# Patient Record
Sex: Male | Born: 1990 | Race: White | Hispanic: No | Marital: Single | Smoking: Former smoker
Health system: Southern US, Community
[De-identification: ages and names within clinical notes are randomized; demographics above are authoritative.]

## PROBLEM LIST (undated history)

## (undated) DIAGNOSIS — F1021 Alcohol dependence, in remission: Secondary | ICD-10-CM

## (undated) DIAGNOSIS — J45909 Unspecified asthma, uncomplicated: Secondary | ICD-10-CM

## (undated) DIAGNOSIS — S43109A Unspecified dislocation of unspecified acromioclavicular joint, initial encounter: Secondary | ICD-10-CM

## (undated) DIAGNOSIS — L910 Hypertrophic scar: Secondary | ICD-10-CM

## (undated) HISTORY — DX: Unspecified dislocation of unspecified acromioclavicular joint, initial encounter: S43.109A

## (undated) HISTORY — DX: Hypertrophic scar: L91.0

## (undated) HISTORY — DX: Unspecified asthma, uncomplicated: J45.909

## (undated) HISTORY — DX: Alcohol dependence, in remission: F10.21

---
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
## 2009-09-03 ENCOUNTER — Ambulatory Visit: Payer: Self-pay | Admitting: Family Medicine

## 2009-09-03 DIAGNOSIS — J45909 Unspecified asthma, uncomplicated: Secondary | ICD-10-CM | POA: Insufficient documentation

## 2009-09-03 DIAGNOSIS — L910 Hypertrophic scar: Secondary | ICD-10-CM

## 2009-09-03 DIAGNOSIS — Z9189 Other specified personal risk factors, not elsewhere classified: Secondary | ICD-10-CM | POA: Insufficient documentation

## 2009-09-03 DIAGNOSIS — S43109A Unspecified dislocation of unspecified acromioclavicular joint, initial encounter: Secondary | ICD-10-CM | POA: Insufficient documentation

## 2009-09-13 ENCOUNTER — Encounter: Admission: RE | Admit: 2009-09-13 | Discharge: 2009-10-02 | Payer: Self-pay | Admitting: Family Medicine

## 2009-09-20 ENCOUNTER — Encounter: Payer: Self-pay | Admitting: Family Medicine

## 2009-10-03 ENCOUNTER — Encounter: Payer: Self-pay | Admitting: Family Medicine

## 2010-03-25 ENCOUNTER — Ambulatory Visit: Payer: Self-pay | Admitting: Family Medicine

## 2010-04-10 ENCOUNTER — Encounter: Payer: Self-pay | Admitting: Family Medicine

## 2010-06-03 ENCOUNTER — Ambulatory Visit
Admission: RE | Admit: 2010-06-03 | Discharge: 2010-06-03 | Payer: Self-pay | Source: Home / Self Care | Attending: Family Medicine | Admitting: Family Medicine

## 2010-06-03 DIAGNOSIS — J069 Acute upper respiratory infection, unspecified: Secondary | ICD-10-CM | POA: Insufficient documentation

## 2010-06-13 NOTE — Letter (Signed)
Summary: Out of Work  [employer] [facility]  [address] [place], [place] [address]   Phone: [phone]  Fax: [phone]    March 25, 2010   Employee:  John Silva [facility]    To Whom It May Concern:   For Medical reasons, please excuse the above named person from work/school as he is potentially contagious.   If you need additional information, please feel free to contact our office.         Sincerely,    Crawford Givens MD

## 2010-06-13 NOTE — Miscellaneous (Signed)
Summary: PT Discharge/[facility]  [facility]/ [facility]   Imported By: Lanelle Bal 10/11/2009 11:40:36  _____________________________________________________________________  External Attachment:    Type:   Image     Comment:   External Document

## 2010-06-13 NOTE — Assessment & Plan Note (Signed)
Summary: ?SINUS INFECTION/CLE   Vital Signs:  Patient profile:   20 year old male Height:      67 inches Weight:      135.0 pounds BMI:     21.22 Temp:     98.3 degrees F oral Pulse rate:   80 / minute Pulse rhythm:   regular BP sitting:   120 / 78  (left arm) Cuff size:   regular  Vitals Entered By: Benny Lennert CMA Duncan Dull) (June 03, 2010 12:35 PM)  History of Present Illness: Chief complaint Sinus Infection  20 year old male:  For 2 or 3 days, pain behind eyes.     Acute Visit History:      The patient complains of cough, nasal discharge, and sinus problems.  These symptoms began 3 days ago.  There is no history of wheezing, sleep interference, shortness of breath, respiratory retractions, tachypnea, cyanosis, or interference with oral intake associated with his cough.        He complains of sinus pressure, nasal congestion, and purulent drainage.  The patient has had a past history of sinusitis.        Urine output has been normal.        Allergies (verified): No Known Drug Allergies  Past History:  Past medical, surgical, family and social histories (including risk factors) reviewed, and no changes noted (except as noted below).  Past Medical History: Reviewed history from 09/03/2009 and no changes required. CHICKENPOX, HX OF (ICD-V15.9) ASTHMA, CHILDHOOD (ICD-493.00)    Past Surgical History: Reviewed history from 09/03/2009 and no changes required. none  Family History: Reviewed history from 09/03/2009 and no changes required. Family History of Alcoholism/Addiction Family History of Arthritis Family History High cholesterol Family History Hypertension Family History Lung cancer  Social History: Reviewed history from 09/03/2009 and no changes required. Occupation:car prep Single Never Smoked Alcohol use-no Drug use-no Regular exercise-yes  Review of Systems       REVIEW OF SYSTEMS GEN: Acute illness details above. CV: No chest pain or  SOB GI: No noted N or V Otherwise, pertinent positives and negatives are noted in the HPI.   Physical Exam  Additional Exam:  GEN: WDWN, NAD; alert,appropriate and cooperative throughout exam HEENT: Normocephalic and atraumatic. Throat clear, w/o exudate, no LAD, R TM clear, L TM - good landmarks, No fluid present. rhinnorhea.  Left frontal and maxillary sinuses: NT Right frontal and maxillary sinuses: NT NECK: No ant or post LAD CV: RRR, No M/G/R PULM: no resp distress, no accessory muscles.  No retractions. no w/c/r ABD: S,NT,ND,+BS, No HSM EXTR: no c/c/e PSYCH: full affect, pleasant, conversant    Impression & Recommendations:  Problem # 1:  URI (ICD-465.9) Assessment New probable URI supportive care  patient concerned about sinus inf - gave abx to hold in case clinically worsening 7-10 days from onset  Complete Medication List: 1)  Amoxicillin 875 Mg Tabs (Amoxicillin) .Marland Kitchen.. 1 by mouth two times a day Prescriptions: AMOXICILLIN 875 MG TABS (AMOXICILLIN) 1 by mouth two times a day  #20 x 0   Entered and Authorized by:   Hannah Beat MD   Signed by:   Hannah Beat MD on 06/03/2010   Method used:   Print then Give to Patient   RxID:   [id number]    Orders Added: 1)  Est. Patient Level III [14782]    Prior Medications (reviewed today): None Current Allergies (reviewed today): No known allergies

## 2010-06-13 NOTE — Miscellaneous (Signed)
Summary: Initial Summary for PT/[place]  Initial Summary for PT/[place]   Imported By: Sherian Rein 04/16/2010 08:33:53  _____________________________________________________________________  External Attachment:    Type:   Image     Comment:   External Document

## 2010-06-13 NOTE — Assessment & Plan Note (Signed)
Summary: COUGH,CONGESTION/CLE   Vital Signs:  Patient profile:   20 year old male Height:      67 inches Weight:      128.25 pounds BMI:     20.16 O2 Sat:      98 % on Room air Temp:     98.2 degrees F oral Pulse rate:   80 / minute Pulse rhythm:   regular Resp:     16 per minute BP sitting:   124 / 70  (left arm) Cuff size:   regular  Vitals Entered By: Delilah Shan CMA Akylah Hascall Dull) (March 25, 2010 9:39 AM)  O2 Flow:  Room air CC: Cough, congestion   History of Present Illness: "Cough, foggy headed". Pressure in ears, head, eyes, nose.  +Sneeze, HA, rhinorrhea.  chest sore from coughing.  Started 2 weeks ago, getting worse since Wednesday.  No wheeze.  no fevers, no vomiting.  Working at [employer] and in school at [employer]    Allergies: No Known Drug Allergies  Review of Systems       See HPI.  Otherwise negative.    Physical Exam  General:  GEN: nad, alert and oriented HEENT: mucous membranes moist, TM w/o erythema, nasal epithelium injected, OP with cobblestoning, frontal sinus tender to palpation bilaterally NECK: supple w/o LA CV: rrr. PULM: ctab, no inc wob ABD: soft, +bs EXT: no edema    Impression & Recommendations:  Problem # 1:  ACUTE FRONTAL SINUSITIS (ICD-461.1) Start antibiotics given exam and duration.  supportive tx and follow up as needed.  Nontoxic.   His updated medication list for this problem includes:    Zithromax 250 Mg Tabs (Azithromycin) .Marland Kitchen... 2 by mouth today and then 1 by mouth once daily for 4 days  Complete Medication List: 1)  Zithromax 250 Mg Tabs (Azithromycin) .... 2 by mouth today and then 1 by mouth once daily for 4 days  Patient Instructions: 1)  Get plenty of rest, drink lots of clear liquids, and use Tylenol or Ibuprofen for fever and comfort. Start the antibiotics and go back to work/school when you are having less pain/congestion. Prescriptions: ZITHROMAX 250 MG TABS (AZITHROMYCIN) 2 by mouth today and then 1 by mouth once  daily for 4 days  #6 x 0   Entered and Authorized by:   Crawford Givens MD   Signed by:   Crawford Givens MD on 03/25/2010   Method used:   [facility] to        [facility]  [address] (retail)       [address]       [place], [place]  [address]       Ph: [phone] or [phone]       Fax: [phone]   RxID:   [id number]      Prior Medications (reviewed today): None Current Allergies (reviewed today): No known allergies

## 2010-06-13 NOTE — Miscellaneous (Signed)
Summary: PT Initial Summary/[facility]  PT Initial Summary/[facility]   Imported By: Lanelle Bal 09/26/2009 12:12:00  _____________________________________________________________________  External Attachment:    Type:   Image     Comment:   External Document

## 2010-06-13 NOTE — Assessment & Plan Note (Signed)
Summary: NEW PT TO EST/CLE   Vital Signs:  Patient profile:   20 year old male Height:      67 inches Weight:      128.6 pounds BMI:     20.21 Temp:     98.1 degrees F oral Pulse rate:   92 / minute Pulse rhythm:   regular BP sitting:   124 / 80  (left arm) Cuff size:   regular  Vitals Entered By: Benny Lennert CMA Duncan Dull) (September 03, 2009 2:01 PM)  History of Present Illness: Chief complaint new patient to be established   20 year old male:  Shoulder and ear.   R > L keloid, ears were pierced with the large piercings, now has large R posterior keloid and small L posterior ear keloids.  R shoulder, hurt last year at wrestling. AC separation winging scapula ? SLAP vs labral tear Struck the ground, ac separation, then activity mod, was able to wrestle and at regionals, hurt shoulder again, severe winging. Had an MR arthrogram ordered by Gardiner Fanti. Not available for my review.   Saw Eula Listen -  Had MRI of the shoulder  Preventive Screening-Counseling & Management  Alcohol-Tobacco     Smoking Status: never  Caffeine-Diet-Exercise     Does Patient Exercise: yes      Drug Use:  no.    Allergies (verified): No Known Drug Allergies  Past History:  Past medical, surgical, family and social histories (including risk factors) reviewed, and no changes noted (except as noted below).  Past Medical History: CHICKENPOX, HX OF (ICD-V15.9) ASTHMA, CHILDHOOD (ICD-493.00)    Past Surgical History: none  Family History: Reviewed history and no changes required. Family History of Alcoholism/Addiction Family History of Arthritis Family History High cholesterol Family History Hypertension Family History Lung cancer  Social History: Reviewed history and no changes required. 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 GI: No n/v/d Pulm: No SOB, cough, wheezing Interactive and getting along well at home.  Otherwise, ROS is as per the HPI.   Physical Exam  General:  GEN: Well-developed,well-nourished,in no acute distress; alert,appropriate and cooperative throughout examination HEENT: Normocephalic and atraumatic without obvious abnormalities. No apparent alopecia or balding. Ears, externally no deformities PULM: Breathing comfortably in no respiratory distress EXT: No clubbing, cyanosis, or edema PSYCH: Normally interactive. Cooperative during the interview. Pleasant. Friendly and conversant. Not anxious or depressed appearing. Normal, full affect.  Msk:  Shoulder: R Inspection: SEVERE SCAPULAR WINGING ON THE RIGHT Ecchymosis/edema: neg  AC joint, scapula, clavicle: NT Cervical spine: NT, full ROM Abduction: full, 5/5 Flexion: full, 5/5 IR, full, lift-off: 5/5 ER at neutral: full, 5/5 AC crossover and compression: neg Neer: neg Hawkins: mildly positive with a feeling of instability Drop Test: neg Empty Can: neg Supraspinatus insertion: NT Bicipital groove: TTP Sulcus sign: neg, but there is increased give Apprehension: Some apprehension O'Brien's: mild positive Jobe Relocation: No increased give with manipulation of head or no dramatic translation, but when externally rotated, less apprehension when hand placed on anterior shoulder with posterior force applied Crank: neg, but some apprehension Load and shift laxity: Scapular dyskinesis: SEVERE AS ABOVE  With 90 deg abduction, End of EROM, Biceps function testing causes severe pain on R, minimal  str Skin:  large posterior keloids, R > L earlobes   Impression & Recommendations:  Problem # 1:  ACROMIOCLAVICULAR JOINT SEPARATION, RIGHT (ICD-831.04) Assessment New severe scapular winging h/o ac joint  separation concern for SLAP or other labral tear  obtain MR arthrogram report from [facility] I am going to refer to PT for formal scapular training and RTC work To call me in 4 weeks -- I would like Dr. Ave Filter to see him if no better. Highly active young man with potential SLAP and severe shoulder pain limiting function  Orders: Physical Therapy Referral (PT)  Problem # 2:  KELOID (ICD-701.4)  Keloids   Keloid Injections x 2, R and L keloids on earlobes: Prepped with alcohol. Multiple Intralesional injections performed, with a mixture of 3 cc of lidocaine 1% and 1 cc of Kenalog 10 mg. 0.75 mL injected in total in both ears. Tol well with some mild bleeding, relieved with direct pressure.  Orders: Kenalog 10 mg inj ([id number]) EMR Misc Charge Code [facility])  Patient Instructions: 1)  Referral Appointment Information 2)  Day/Date: 3)  Time: 4)  Place/MD: 5)  Address: 6)  Phone/Fax: 7)  Patient given appointment information. Information/Orders faxed/mailed.   Prior Medications (reviewed today): None Current Allergies (reviewed today): No known allergies

## 2011-03-19 ENCOUNTER — Ambulatory Visit (INDEPENDENT_AMBULATORY_CARE_PROVIDER_SITE_OTHER): Payer: 59 | Admitting: Family Medicine

## 2011-03-19 ENCOUNTER — Encounter: Payer: Self-pay | Admitting: Family Medicine

## 2011-03-19 VITALS — BP 144/82 | HR 84 | Temp 98.0°F | Wt 128.4 lb

## 2011-03-19 DIAGNOSIS — J329 Chronic sinusitis, unspecified: Secondary | ICD-10-CM

## 2011-03-19 NOTE — Progress Notes (Signed)
  Subjective:    Patient ID: John Silva, male    DOB: 04/25/1991, 20 y.o.   MRN: [id number]  HPI CC: congestion/cough  1wk h/o sinus congestion, drainage, RN.  Got worse yesterday - more headache, drainage, frontal pressure.  Mild cough, dry tickle in back of throat.  Today started with more nausea, body aches, diarrhea.  Has had 3 stools, watery.  + chills.  Sharp lower abd pain.  Has taken tylenol, mucinex DM for this.  + pressure in right ear.  Yellow mucous from nose.  Not significant post nasal drainage.  Out of work yesterday and today 2/2 sick.  Works at [employer].  No fevers, vomiting, rashes.  No tooth pain.  No sick contacts at home.  Pt smokes rarely.  Had asthma as child, grew out of it.  Review of Systems Per HPI    Objective:   Physical Exam  Nursing note and vitals reviewed. Constitutional: He appears well-developed and well-nourished. No distress.  HENT:  Head: Normocephalic and atraumatic.  Right Ear: Hearing, tympanic membrane, external ear and ear canal normal.  Left Ear: Hearing, tympanic membrane, external ear and ear canal normal.  Nose: Mucosal edema (left swollen turbinates, almost shut) present. No rhinorrhea. Right sinus exhibits frontal sinus tenderness. Right sinus exhibits no maxillary sinus tenderness. Left sinus exhibits no maxillary sinus tenderness and no frontal sinus tenderness.  Mouth/Throat: Oropharynx is clear and moist. No oropharyngeal exudate.  Eyes: Conjunctivae and EOM are normal. Pupils are equal, round, and reactive to light. No scleral icterus.  Neck: Normal range of motion. Neck supple.  Cardiovascular: Normal rate, regular rhythm, normal heart sounds and intact distal pulses.   No murmur heard. Pulmonary/Chest: Effort normal and breath sounds normal. No respiratory distress. He has no wheezes. He has no rales.  Abdominal: Soft. Bowel sounds are normal. He exhibits no distension and no mass. There is tenderness (mild lower abd  discomfort to palpation). There is no rebound and no guarding.  Lymphadenopathy:    He has no cervical adenopathy.  Skin: Skin is warm and dry. No rash noted.      Assessment & Plan:

## 2011-03-19 NOTE — Assessment & Plan Note (Signed)
Going on 1 wk, given acute worsening yesterday, treat with augmentin. Update if not improving as expected. Diarrhea, stomach upset could be due to sinus drainage. Monitor for now.  offered phenergan, pt declined currently.  Will call if wants.

## 2011-03-19 NOTE — Patient Instructions (Addendum)
This could all be due to sinus infection. Continue mucinex with plenty of fluid.  Start nasal saline irrigation throughout the day. If worsening, or fever >101, or not better in next 2 days, start augmentin 10 day course. We will watch diarrhea for now, if worsening let me know.  I expect it to run it's course over next few days.  Small sips of fluid throughout the day. Out of work for next 2 days.  Let me know if nausea not improving for nausea medicine.

## 2012-05-21 ENCOUNTER — Encounter: Payer: Self-pay | Admitting: Family Medicine

## 2012-05-21 ENCOUNTER — Ambulatory Visit (INDEPENDENT_AMBULATORY_CARE_PROVIDER_SITE_OTHER): Payer: 59 | Admitting: Family Medicine

## 2012-05-21 VITALS — BP 148/110 | HR 98 | Temp 98.3°F | Wt 133.8 lb

## 2012-05-21 DIAGNOSIS — J111 Influenza due to unidentified influenza virus with other respiratory manifestations: Secondary | ICD-10-CM

## 2012-05-21 DIAGNOSIS — R030 Elevated blood-pressure reading, without diagnosis of hypertension: Secondary | ICD-10-CM

## 2012-05-21 MED ORDER — GUAIFENESIN-CODEINE 100-10 MG/5ML PO SYRP: 5.0000 mL | ORAL_SOLUTION | Freq: Every evening | ORAL | Status: DC | PRN

## 2012-05-21 NOTE — Assessment & Plan Note (Signed)
Could be due to acute illness. rec monitor at home and update [place] if elevated BP persists.

## 2012-05-21 NOTE — Patient Instructions (Addendum)
Your blood pressure is too high today!  Check blood pressure at home - if consistently >140/90, return to see [place].   I do think you have flu like illness. Viral infections usually take 7-10 days to resolve.  The cough can last several weeks to go away. Use medication as prescribed:  cheratussin for cough at night. Push fluids and plenty of rest. Let me know if fever >101 past 5 days, if symptoms continued past 10 days or if any sudden worsening. Call clinic with questions.  Good to see you today.

## 2012-05-21 NOTE — Assessment & Plan Note (Signed)
Supportive care as per instructions. Red flags to return discussed. rec take flu shot next year.

## 2012-05-21 NOTE — Progress Notes (Signed)
  Subjective:    Patient ID: John Silva, male    DOB: 07/09/1990, 22 y.o.   MRN: [id number]  HPI WU:[id number]?  3d h/o RN, ST, coughing, sneezing, HA, fever to 101.3 this am, chills, body ache, SOB, decreased energy.  Blowing nose with bloody mucous.  + diarrhea.  Progressive onset of sxs.  Has been taking tylenol, dayquil, nyquil, cough med.  No abd pain, ear or tooth pain, rashes.  Parents sick at home as well as boss at work. H/o asthma as child. Smoking - not since ill.  Smokes a few cig/day.  Did not receive flu shot this year.  Past Medical History  Diagnosis Date  . Childhood asthma   . Keloid scar   . History of chicken pox   . Acromioclavicular joint separation     right     Review of Systems Per HPI    Objective:   Physical Exam  Nursing note and vitals reviewed. Constitutional: He appears well-developed and well-nourished. No distress.       Tired, congfested, nontoxic  HENT:  Head: Normocephalic and atraumatic.  Right Ear: Hearing, tympanic membrane, external ear and ear canal normal.  Left Ear: Hearing, tympanic membrane, external ear and ear canal normal.  Nose: Mucosal edema present. No rhinorrhea. Right sinus exhibits no maxillary sinus tenderness and no frontal sinus tenderness. Left sinus exhibits frontal sinus tenderness. Left sinus exhibits no maxillary sinus tenderness.  Mouth/Throat: Uvula is midline and mucous membranes are normal. Posterior oropharyngeal edema and posterior oropharyngeal erythema present. No oropharyngeal exudate or tonsillar abscesses.  Eyes: Conjunctivae normal and EOM are normal. Pupils are equal, round, and reactive to light. No scleral icterus.  Neck: Normal range of motion. Neck supple.  Cardiovascular: Normal rate, regular rhythm, normal heart sounds and intact distal pulses.   No murmur heard. Pulmonary/Chest: Effort normal and breath sounds normal. No respiratory distress. He has no wheezes. He has no rales.   Harsh dry cough present  Lymphadenopathy:    He has no cervical adenopathy.  Skin: Skin is warm and dry. No rash noted.       Assessment & Plan:

## 2012-05-26 ENCOUNTER — Ambulatory Visit (INDEPENDENT_AMBULATORY_CARE_PROVIDER_SITE_OTHER): Payer: 59 | Admitting: Family Medicine

## 2012-05-26 ENCOUNTER — Encounter: Payer: Self-pay | Admitting: Family Medicine

## 2012-05-26 ENCOUNTER — Ambulatory Visit: Payer: 59 | Admitting: Family Medicine

## 2012-05-26 ENCOUNTER — Other Ambulatory Visit (INDEPENDENT_AMBULATORY_CARE_PROVIDER_SITE_OTHER): Payer: 59

## 2012-05-26 VITALS — BP 150/110 | HR 80 | Temp 98.3°F | Wt 133.0 lb

## 2012-05-26 DIAGNOSIS — R030 Elevated blood-pressure reading, without diagnosis of hypertension: Secondary | ICD-10-CM

## 2012-05-26 LAB — BASIC METABOLIC PANEL
BUN: 6 mg/dL (ref 6–23)
CO2: 31 mEq/L (ref 19–32)
Calcium: 9.6 mg/dL (ref 8.4–10.5)
Chloride: 101 mEq/L (ref 96–112)
Creatinine, Ser: 0.9 mg/dL (ref 0.4–1.5)
GFR: 114.84 mL/min (ref >60.00)
Glucose, Bld: 123 mg/dL — ABNORMAL HIGH (ref 70–99)
Potassium: 4.4 mEq/L (ref 3.5–5.1)
Sodium: 138 mEq/L (ref 135–145)

## 2012-05-26 LAB — TSH: TSH: 3.08 u[IU]/mL (ref 0.35–5.50)

## 2012-05-26 NOTE — Progress Notes (Signed)
Nature conservation officer at [facility] [address] [place] [address] Phone: [phone] Fax: [phone]  Date:  05/26/2012   Name:  John Silva   DOB:  Dec 19, 1990   MRN:  [id number] Gender: male Age: 22 y.o.  PCP:  Hannah Beat, MD  Evaluating MD: Hannah Beat, MD   Chief Complaint: Follow up with BP   History of Present Illness:  John Silva is a 22 y.o. pleasant patient who presents with the following:  No cold medication in the last three days. Was high then.  170/120 last night This morning 147/98.  Elevated BP last OV when here for flu-like illness. Improved, off of all meds for cold now and BP still high. Drinks about 6 beers or so a day, sometimes more.  No drugs. Dips.  Patient Active Problem List  Diagnosis  . ASTHMA, CHILDHOOD  . KELOID  . ACROMIOCLAVICULAR JOINT SEPARATION, RIGHT  . CHICKENPOX, HX OF  . Sinusitis  . Influenza-like illness  . Elevated blood pressure reading without diagnosis of hypertension    Past Medical History  Diagnosis Date  . Childhood asthma   . Keloid scar   . History of chicken pox   . Acromioclavicular joint separation     right    No past surgical history on file.  History  Substance Use Topics  . Smoking status: Current Some Day Smoker  . Smokeless tobacco: Not on file  . Alcohol Use: Yes     Comment: Regular    Family History  Problem Relation Age of Onset  . Alcohol abuse      Family history  . Arthritis      Family history  . Hyperlipidemia      Family history  . Hypertension      Family history  . Lung cancer      Family history    No Known Allergies  Medication list has been reviewed and updated.  Outpatient Prescriptions Prior to Visit  Medication Sig Dispense Refill  . acetaminophen (TYLENOL) 325 MG tablet Take 650 mg by mouth every 6 (six) hours as needed.        . [DISCONTINUED] guaiFENesin-codeine (ROBITUSSIN AC) 100-10 MG/5ML syrup Take 5 mLs by mouth at  bedtime as needed for cough (sedation precautions).  180 mL  0   Last reviewed on 05/26/2012 12:09 PM by Eliezer Bottom, CMA  Review of Systems:   GEN: No acute illnesses, no fevers, chills. GI: No n/v/d, eating normally Pulm: No SOB Interactive and getting along well at home.  Otherwise, ROS is as per the HPI.   Physical Examination: BP 150/110  Pulse 80  Wt 133 lb (60.328 kg)  Ideal Body Weight:     GEN: WDWN, NAD, Non-toxic, A & O x 3 HEENT: Atraumatic, Normocephalic. Neck supple. No masses, No LAD. Ears and Nose: No external deformity. CV: RRR, No M/G/R. No JVD. No thrill. No extra heart sounds. PULM: CTA B, no wheezes, crackles, rhonchi. No retractions. No resp. distress. No accessory muscle use. EXTR: No c/c/e NEURO Normal gait.  PSYCH: Normally interactive. Conversant. Not depressed or anxious appearing.  Calm demeanor.    Assessment and Plan:  1. Elevated blood pressure  Basic metabolic panel, TSH  2. Elevated blood pressure reading without diagnosis of hypertension     Check basic labs Low weight. ETOH may be contributing, he admits to having a poor diet. He is going to work on [employer], exercising, cutting back on  his ETOH and recheck in 2 months.  Orders Today:  Orders Placed This Encounter  Procedures  . Basic metabolic panel  . TSH    Updated Medication List: (Includes new medications, updates to list, dose adjustments) No orders of the defined types were placed in this encounter.    Medications Discontinued: Medications Discontinued During This Encounter  Medication Reason  . guaiFENesin-codeine (ROBITUSSIN AC) 100-10 MG/5ML syrup Error     Hannah Beat, MD

## 2012-05-26 NOTE — Patient Instructions (Signed)

## 2012-05-28 ENCOUNTER — Encounter: Payer: Self-pay | Admitting: *Deleted

## 2012-07-26 ENCOUNTER — Ambulatory Visit: Payer: 59 | Admitting: Family Medicine

## 2012-08-12 ENCOUNTER — Telehealth: Payer: Self-pay

## 2012-08-12 NOTE — Telephone Encounter (Signed)
pts mother found out that polycystic kidney disease is in their family and since pt has hypertension should pt have blood test or other testing for polycystic kidney disease.Please advise.

## 2012-10-19 NOTE — Telephone Encounter (Signed)
See moms note

## 2012-11-22 ENCOUNTER — Encounter: Payer: Self-pay | Admitting: Family Medicine

## 2012-11-22 ENCOUNTER — Ambulatory Visit (INDEPENDENT_AMBULATORY_CARE_PROVIDER_SITE_OTHER): Payer: 59 | Admitting: Family Medicine

## 2012-11-22 VITALS — BP 140/92 | HR 85 | Temp 97.9°F | Ht 67.0 in | Wt 129.0 lb

## 2012-11-22 DIAGNOSIS — R030 Elevated blood-pressure reading, without diagnosis of hypertension: Secondary | ICD-10-CM

## 2012-11-22 DIAGNOSIS — Z8271 Family history of polycystic kidney: Secondary | ICD-10-CM

## 2012-11-22 DIAGNOSIS — IMO0001 Reserved for inherently not codable concepts without codable children: Secondary | ICD-10-CM

## 2012-11-22 NOTE — Patient Instructions (Addendum)
REFERRAL: GO THE THE FRONT ROOM AT THE ENTRANCE OF OUR CLINIC, NEAR CHECK IN. ASK FOR MARION. SHE WILL HELP YOU SET UP YOUR REFERRAL. DATE: TIME:  

## 2012-11-22 NOTE — Progress Notes (Signed)
Nature conservation officer at [facility] [address] [place] [address] Phone: [phone] Fax: [phone]  Date:  11/22/2012   Name:  John Silva   DOB:  Jul 29, 1990   MRN:  [id number] Gender: male Age: 22 y.o.  Primary Physician:  Hannah Beat, MD  Evaluating MD: Hannah Beat, MD   Chief Complaint: Follow-up   History of Present Illness:  John Silva is a 22 y.o. pleasant patient who presents with the following:  Mom's 1st cousin and mom's 2 cousins. Zak's - all 1st cousin once removed.  PKD, unsure of type  Patient Active Problem List   Diagnosis Date Noted  . Elevated blood pressure reading without diagnosis of hypertension 05/21/2012  . ASTHMA, CHILDHOOD 09/03/2009  . KELOID 09/03/2009  . ACROMIOCLAVICULAR JOINT SEPARATION, RIGHT 09/03/2009    Past Medical History  Diagnosis Date  . Childhood asthma   . Keloid scar   . History of chicken pox   . Acromioclavicular joint separation     right    No past surgical history on file.  History   Social History  . Marital Status: Single    Spouse Name: N/A    Number of Children: N/A  . Years of Education: N/A   Occupational History  . Not on file.   Social History Main Topics  . Smoking status: Current Some Day Smoker  . Smokeless tobacco: Not on file  . Alcohol Use: Yes     Comment: Regular  . Drug Use: No  . Sexually Active: Not on file   Other Topics Concern  . Not on file   Social History Narrative  . No narrative on file    Family History  Problem Relation Age of Onset  . Alcohol abuse Maternal Grandfather     Family history  . Arthritis      Family history  . Hyperlipidemia      Family history  . Hypertension      Family history  . Lung cancer      Family history  . Polycystic kidney disease Cousin     1st cousin once removed  . Polycystic kidney disease Cousin     1st cousin once removed  . Polycystic kidney disease Cousin     1st cousin once removed     No Known Allergies  Current Outpatient Prescriptions on File Prior to Visit  Medication Sig Dispense Refill  . acetaminophen (TYLENOL) 325 MG tablet Take 650 mg by mouth every 6 (six) hours as needed.         No current facility-administered medications on file prior to visit.     Review of Systems:   GEN: No acute illnesses, no fevers, chills. GI: No n/v/d, eating normally Pulm: No SOB Interactive and getting along well at home.  Otherwise, ROS is as per the HPI.   Physical Examination: BP 140/92  Pulse 85  Temp(Src) 97.9 F (36.6 C) (Oral)  Ht 5\' 7"  (1.702 m)  Wt 129 lb (58.514 kg)  BMI 20.2 kg/m2  SpO2 99%  Ideal Body Weight: Weight in (lb) to have BMI = 25: 159.3  GEN: WDWN, NAD, Non-toxic, A & O x 3 HEENT: Atraumatic, Normocephalic. Neck supple. No masses, No LAD. Ears and Nose: No external deformity. CV: RRR, No M/G/R. No JVD. No thrill. No extra heart sounds. PULM: CTA B, no wheezes, crackles, rhonchi. No retractions. No resp. distress. No accessory muscle use. ABD: S, NT, ND, +BS. No rebound. No HSM.  EXTR: No c/c/e NEURO Normal gait.  PSYCH: Normally interactive. Conversant. Not depressed or anxious appearing.  Calm demeanor.    Assessment and Plan:  Family history of polycystic kidney disease - Plan: CT Abdomen Pelvis W Contrast  Elevated blood pressure  3 cousins with polycystic kidney disease, and what they believe is a history that even extends farther back in this with multiple other family members not diagnosed in the past, evaluation seems prudent. Literature reviewed, and for more definitive evaluation at this age, we will obtain a CT of the abdomen and pelvis with contrast.  Orders Today:  Orders Placed This Encounter  Procedures  . CT Abdomen Pelvis W Contrast    RM/MARION [phone]/PT NOT DIAB/NO LABS NEEDED/[employer] INS PC PENDING/PT TO DRINK 2 BTLS CM (? IF [place] IS BETTER OPTION 7/14)    Standing Status: Future     Number of Occurrences:       Standing Expiration Date: 02/22/2014    Order Specific Question:  Preferred imaging location?    Answer:  -[address]    Order Specific Question:  Reason for exam:    Answer:  family history of PKD, eval for cysts    Updated Medication List: (Includes new medications, updates to list, dose adjustments) No orders of the defined types were placed in this encounter.    Medications Discontinued: There are no discontinued medications.    Signed, Elpidio Galea. Jerek Meulemans, MD 11/22/2012 8:34 AM

## 2012-11-25 ENCOUNTER — Ambulatory Visit (INDEPENDENT_AMBULATORY_CARE_PROVIDER_SITE_OTHER)
Admission: RE | Admit: 2012-11-25 | Discharge: 2012-11-25 | Disposition: A | Discharge status: Home / Self Care | Payer: 59 | Source: Ambulatory Visit | Attending: Family Medicine | Admitting: Family Medicine

## 2012-11-25 DIAGNOSIS — Z8271 Family history of polycystic kidney: Secondary | ICD-10-CM

## 2012-11-25 MED ORDER — IOHEXOL 300 MG/ML  SOLN
100.0000 mL | Freq: Once | INTRAMUSCULAR | Status: AC | PRN
  Administered 2012-11-25: 100 mL via INTRAVENOUS

## 2013-08-01 ENCOUNTER — Ambulatory Visit (INDEPENDENT_AMBULATORY_CARE_PROVIDER_SITE_OTHER): Payer: 59 | Admitting: Family Medicine

## 2013-08-01 ENCOUNTER — Encounter: Payer: Self-pay | Admitting: Family Medicine

## 2013-08-01 VITALS — BP 150/90 | HR 116 | Temp 98.3°F | Ht 67.0 in | Wt 129.2 lb

## 2013-08-01 DIAGNOSIS — F10239 Alcohol dependence with withdrawal, unspecified: Secondary | ICD-10-CM

## 2013-08-01 DIAGNOSIS — F10939 Alcohol use, unspecified with withdrawal, unspecified: Secondary | ICD-10-CM

## 2013-08-01 DIAGNOSIS — F1020 Alcohol dependence, uncomplicated: Secondary | ICD-10-CM

## 2013-08-01 DIAGNOSIS — R000 Tachycardia, unspecified: Secondary | ICD-10-CM

## 2013-08-01 DIAGNOSIS — R030 Elevated blood-pressure reading, without diagnosis of hypertension: Secondary | ICD-10-CM

## 2013-08-01 MED ORDER — ONDANSETRON 4 MG PO TBDP: 4.0000 mg | ORAL_TABLET | Freq: Three times a day (TID) | ORAL | Status: DC | PRN

## 2013-08-01 NOTE — Progress Notes (Signed)
Pre visit review using our clinic review tool, if applicable. No additional management support is needed unless otherwise documented below in the visit note. 

## 2013-08-01 NOTE — Patient Instructions (Signed)
[facility]

## 2013-08-01 NOTE — Progress Notes (Signed)
Date:  08/01/2013   Name:  John Silva   DOB:  1990-10-29   MRN:  [id number]  Primary Physician:  Hannah Beat, MD   Chief Complaint: Alcohol Problem   Subjective:   History of Present Illness:  John Silva is a 23 y.o. pleasant patient who presents with the following:  12-36 beers Drove home drunk one day.  Stopped last week  Multiple family members.  Heart was racing.  2-4 beers.   Not remembering things well.   See below  Patient Active Problem List   Diagnosis Date Noted  . Alcoholism 08/02/2013  . Elevated blood pressure reading without diagnosis of hypertension 05/21/2012  . ASTHMA, CHILDHOOD 09/03/2009  . ACROMIOCLAVICULAR JOINT SEPARATION, RIGHT 09/03/2009    Past Medical History  Diagnosis Date  . Childhood asthma   . Keloid scar   . History of chicken pox   . Acromioclavicular joint separation     right    No past surgical history on file.  History   Social History  . Marital Status: Single    Spouse Name: N/A    Number of Children: N/A  . Years of Education: N/A   Occupational History  . Not on file.   Social History Main Topics  . Smoking status: Former Games developer  . Smokeless tobacco: Current User  . Alcohol Use: Yes     Comment: Regular  . Drug Use: No  . Sexual Activity: Not on file   Other Topics Concern  . Not on file   Social History Narrative  . No narrative on file    Family History  Problem Relation Age of Onset  . Alcohol abuse Maternal Grandfather     Family history  . Arthritis      Family history  . Hyperlipidemia      Family history  . Hypertension      Family history  . Lung cancer      Family history  . Polycystic kidney disease Cousin     1st cousin once removed  . Polycystic kidney disease Cousin     1st cousin once removed  . Polycystic kidney disease Cousin     1st cousin once removed    No Known Allergies  Medication list has been reviewed and updated.  Review of  Systems:  Otherwise, ROS is as per the HPI.  Objective:   Physical Examination: BP 150/90  Pulse 116  Temp(Src) 98.3 F (36.8 C) (Oral)  Ht 5\' 7"  (1.702 m)  Wt 129 lb 4 oz (58.627 kg)  BMI 20.24 kg/m2  Ideal Body Weight: Weight in (lb) to have BMI = 25: 159.3   GEN: WDWN, NAD, Non-toxic, A & O x 3 HEENT: Atraumatic, Normocephalic. Neck supple. No masses, No LAD. Ears and Nose: No external deformity. EXTR: No c/c/e NEURO Normal gait.  PSYCH: Sweating, mild tremor. Interactive.   Laboratory and Imaging Data: Results for orders placed in visit on 08/01/13  CBC WITH DIFFERENTIAL      Result Value Ref Range   WBC 5.5  4.5 - 10.5 K/uL   RBC 4.84  4.22 - 5.81 Mil/uL   Hemoglobin 15.8  13.0 - 17.0 g/dL   HCT 78.2  95.6 - 21.3 %   MCV 95.2  78.0 - 100.0 fl   MCHC 34.2  30.0 - 36.0 g/dL   RDW 08.6  57.8 - 46.9 %   Platelets 230.0  150.0 - 400.0 K/uL   Neutrophils Relative % 63.5  43.0 - 77.0 %   Lymphocytes Relative 28.2  12.0 - 46.0 %   Monocytes Relative 6.2  3.0 - 12.0 %   Eosinophils Relative 1.9  0.0 - 5.0 %   Basophils Relative 0.2  0.0 - 3.0 %   Neutro Abs 3.5  1.4 - 7.7 K/uL   Lymphs Abs 1.5  0.7 - 4.0 K/uL   Monocytes Absolute 0.3  0.1 - 1.0 K/uL   Eosinophils Absolute 0.1  0.0 - 0.7 K/uL   Basophils Absolute 0.0  0.0 - 0.1 K/uL  BASIC METABOLIC PANEL      Result Value Ref Range   Sodium 140  135 - 145 mEq/L   Potassium 4.0  3.5 - 5.1 mEq/L   Chloride 102  96 - 112 mEq/L   CO2 29  19 - 32 mEq/L   Glucose, Bld 81  70 - 99 mg/dL   BUN 7  6 - 23 mg/dL   Creatinine, Ser 1.0  0.4 - 1.5 mg/dL   Calcium 9.6  8.4 - 16.110.5 mg/dL   GFR 096.04100.37  >54.09>60.00 mL/min  HEPATIC FUNCTION PANEL      Result Value Ref Range   Total Bilirubin 0.8  0.3 - 1.2 mg/dL   Bilirubin, Direct 0.1  0.0 - 0.3 mg/dL   Alkaline Phosphatase 39  39 - 117 U/L   AST 32  0 - 37 U/L   ALT 32  0 - 53 U/L   Total Protein 7.7  6.0 - 8.3 g/dL   Albumin 4.8  3.5 - 5.2 g/dL  TSH      Result Value Ref  Range   TSH 2.68  0.35 - 5.50 uIU/mL     Assessment & Plan:   Alcoholism - Plan: CBC with Differential, Basic metabolic panel, Hepatic function panel, TSH  Tachycardia - Plan: CBC with Differential, Basic metabolic panel, Hepatic function panel, TSH  Elevated blood pressure reading without diagnosis of hypertension  Alcohol withdrawal  >25 minutes spent in face to face time with patient, >50% spent in counselling or coordination of care: Long conversation with patient, his fiance, and his father, John Silva. He only for approximately 5 years or more. He states that he drinks at least 12 beers a day, often 24 beers a day, and sometimes much is 36 beers or more, and he will often take some shots during the day in an above this. At this point, he recognizes that he has a severe alcohol problem. He would like to stop alcohol, but he doesn't know exactly what to do, and would like some advice. His family is here, and additionally his mother is also quite worried and is very supportive to help him get off of alcohol. All of his family and his fianc are very supportive here in the office today.  We discussed potential endorgan damage, and he clearly is exhibiting behaviors consistent with alcoholism and alcohol addiction. He does not have a problem with any sort of drugs. We discussed Alcoholics Anonymous, the importance of getting sponsor, and given his overall state, I recommended that he consider inpatient alcohol rehabilitation. His family is artery looked into this to some degree. They have looked at [facility], and also suggested [facility], [place] an [place] facility [place]. I did my best to answer all of their questions, and I wished him well. At the time of our interview, the patient's last drink was 2 hours prior.  No Follow-up on file.  Orders Placed This  Encounter  Procedures  . CBC with Differential  . Basic metabolic panel  . Hepatic function panel  . TSH    Patient's Medications  New Prescriptions   ONDANSETRON (ZOFRAN-ODT) 4 MG DISINTEGRATING TABLET    Take 1 tablet (4 mg total) by mouth every 8 (eight) hours as needed for nausea or vomiting.  Previous Medications   ACETAMINOPHEN (TYLENOL) 325 MG TABLET    Take 650 mg by mouth every 6 (six) hours as needed.    Modified Medications   No medications on file  Discontinued Medications   No medications on file   Patient Instructions  [facility] [address],  Karleen Hampshire T. Trequan Marsolek, MD, CAQ Sports Medicine  [employer] [facility] [address] [place] [address] Phone: [phone] Fax: [phone]

## 2013-08-02 DIAGNOSIS — F1021 Alcohol dependence, in remission: Secondary | ICD-10-CM

## 2013-08-02 HISTORY — DX: Alcohol dependence, in remission: F10.21

## 2013-08-02 LAB — TSH: TSH: 2.68 u[IU]/mL (ref 0.35–5.50)

## 2013-08-02 LAB — CBC WITH DIFFERENTIAL/PLATELET
BASOS ABS: 0 10*3/uL (ref 0.0–0.1)
Basophils Relative: 0.2 % (ref 0.0–3.0)
EOS PCT: 1.9 % (ref 0.0–5.0)
Eosinophils Absolute: 0.1 10*3/uL (ref 0.0–0.7)
HCT: 46 % (ref 39.0–52.0)
Hemoglobin: 15.8 g/dL (ref 13.0–17.0)
LYMPHS PCT: 28.2 % (ref 12.0–46.0)
Lymphs Abs: 1.5 10*3/uL (ref 0.7–4.0)
MCHC: 34.2 g/dL (ref 30.0–36.0)
MCV: 95.2 fl (ref 78.0–100.0)
MONOS PCT: 6.2 % (ref 3.0–12.0)
Monocytes Absolute: 0.3 10*3/uL (ref 0.1–1.0)
NEUTROS PCT: 63.5 % (ref 43.0–77.0)
Neutro Abs: 3.5 10*3/uL (ref 1.4–7.7)
PLATELETS: 230 10*3/uL (ref 150.0–400.0)
RBC: 4.84 Mil/uL (ref 4.22–5.81)
RDW: 12.5 % (ref 11.5–14.6)
WBC: 5.5 10*3/uL (ref 4.5–10.5)

## 2013-08-02 LAB — BASIC METABOLIC PANEL
BUN: 7 mg/dL (ref 6–23)
CO2: 29 meq/L (ref 19–32)
Calcium: 9.6 mg/dL (ref 8.4–10.5)
Chloride: 102 mEq/L (ref 96–112)
Creatinine, Ser: 1 mg/dL (ref 0.4–1.5)
GFR: 100.37 mL/min (ref >60.00)
GLUCOSE: 81 mg/dL (ref 70–99)
POTASSIUM: 4 meq/L (ref 3.5–5.1)
SODIUM: 140 meq/L (ref 135–145)

## 2013-08-02 LAB — HEPATIC FUNCTION PANEL
ALBUMIN: 4.8 g/dL (ref 3.5–5.2)
ALK PHOS: 39 U/L (ref 39–117)
ALT: 32 U/L (ref 0–53)
AST: 32 U/L (ref 0–37)
BILIRUBIN DIRECT: 0.1 mg/dL (ref 0.0–0.3)
TOTAL PROTEIN: 7.7 g/dL (ref 6.0–8.3)
Total Bilirubin: 0.8 mg/dL (ref 0.3–1.2)

## 2014-02-27 ENCOUNTER — Telehealth: Payer: Self-pay

## 2014-02-27 NOTE — Telephone Encounter (Signed)
Mrs John Silva request medical records that relate to alcoholism. Advised pt should sign record release and copies of medical record will be copied for pt. Mrs John Silva wants to know how many visits mention alcoholism; advised pts mother could not answer that question and info would be included in medical records. Mrs John Silva does not want to wait [facility]; she asked if I was advising her that Dr Cyndie Chimeopland's office would not assist in helping pt not have to pay more than he should for his rehab time. Advised Mrs John Silva that was not what I was saying and I advised her again the process for getting medical records and having the medical records in writing would alleviate any verbal misunderstanding of information requested. Mrs John Silva said good by and ended call. Charline BillsAdrienne Sauls office manager advised of this call.

## 2014-03-07 ENCOUNTER — Telehealth: Payer: Self-pay

## 2014-03-07 NOTE — Telephone Encounter (Signed)
Mrs John Silva pts mother left v/m; pt is in rehab program for alcoholism now and ins is refusing to pay and pt is in appeal process for ins to pay. Mrs John Silva request letter from Dr Patsy Lageropland advising that pt would benefit from rehab program for alcoholism. Mrs John Silva request cb. Pt last seen 08/01/13.

## 2014-03-08 ENCOUNTER — Encounter: Payer: Self-pay | Admitting: Family Medicine

## 2014-03-08 NOTE — Telephone Encounter (Signed)
done

## 2014-03-08 NOTE — Telephone Encounter (Signed)
Left message for Pam (mom) that letter is ready to be picked up at front desk.

## 2014-03-08 NOTE — Telephone Encounter (Signed)
Please call Princess PernaPam, Zak, or Roe Coombson.  I am happy to write a letter in support [facility].  I will basically say in it that he has one of the most serious cases of alcoholism that I have seen in someone his age, and that I previously strongly recommended either inpatient or outpatient alcohol rehabilitation to help him recover.  I hope that this helps.

## 2014-03-08 NOTE — Telephone Encounter (Signed)
Pam (mom)notified as instructed by telephone.  She states that sounded exactly what she needed.  Advised I would call her as soon as the letter is ready for pick up.

## 2014-06-15 ENCOUNTER — Ambulatory Visit (INDEPENDENT_AMBULATORY_CARE_PROVIDER_SITE_OTHER): Payer: 59 | Admitting: Family Medicine

## 2014-06-15 ENCOUNTER — Encounter: Payer: Self-pay | Admitting: Family Medicine

## 2014-06-15 VITALS — BP 120/80 | HR 83 | Temp 98.2°F | Ht 67.0 in | Wt 130.2 lb

## 2014-06-15 DIAGNOSIS — Z209 Contact with and (suspected) exposure to unspecified communicable disease: Secondary | ICD-10-CM

## 2014-06-15 DIAGNOSIS — Z202 Contact with and (suspected) exposure to infections with a predominantly sexual mode of transmission: Secondary | ICD-10-CM

## 2014-06-15 MED ORDER — PENICILLIN G BENZATHINE 1200000 UNIT/2ML IM SUSP
1.2000 10*6.[IU] | Freq: Once | INTRAMUSCULAR | Status: AC
  Administered 2014-06-15: 1.2 10*6.[IU] via INTRAMUSCULAR

## 2014-06-15 NOTE — Progress Notes (Signed)
Pre visit review using our clinic review tool, if applicable. No additional management support is needed unless otherwise documented below in the visit note. 

## 2014-06-16 LAB — HEPATITIS C ANTIBODY: HCV AB: NEGATIVE

## 2014-06-16 LAB — RPR

## 2014-06-16 LAB — HEPATITIS B CORE ANTIBODY, IGM: Hep B C IgM: NONREACTIVE

## 2014-06-16 LAB — HEPATITIS B SURFACE ANTIBODY,QUALITATIVE: Hep B S Ab: POSITIVE — AB

## 2014-06-16 LAB — GC/CHLAMYDIA PROBE AMP, URINE
Chlamydia, Swab/Urine, PCR: NEGATIVE
GC PROBE AMP, URINE: NEGATIVE

## 2014-06-16 LAB — HIV ANTIBODY (ROUTINE TESTING W REFLEX): HIV 1&2 Ab, 4th Generation: NONREACTIVE

## 2014-06-16 LAB — HEPATITIS B SURFACE ANTIGEN: HEP B S AG: NEGATIVE

## 2014-06-16 NOTE — Progress Notes (Signed)
Dr. Karleen Hampshire T. Dessirae Scarola, MD, CAQ Sports Medicine Primary Care and Sports Medicine [address] [place], [address] Phone: [phone] Fax: [phone]  06/15/2014  Patient: John Silva, John Silva, John Silva, John Silva, 24 y.o.  Primary Physician:  Hannah Beat, MD  Chief Complaint: STD Testing  Subjective:   John Silva is a 24 y.o. very pleasant male patient who presents with the following:  Very nice young man who presents after his fiance tested positive for syphilis.  She is currently pregnant.  The health department called him and our office.  They recommended Bicillin LA 2.4 million units and syphilis testing.  The patient has never had any symptoms at all.  He tells me that his girlfriend had a normal STD screening 3 months ago when she had a full physical and Pap smear, but when she went for her new OB intake visit she had a positive RPR.  Past Medical History, Surgical History, Social History, Family History, Problem List, Medications, and Allergies have been reviewed and updated if relevant.   GEN: No acute illnesses, no fevers, chills. GI: No n/v/d, eating normally Pulm: No SOB Interactive and getting along well at home.  Otherwise, ROS is as per the HPI.  Objective:   BP 120/80 mmHg  Pulse 83  Temp(Src) 98.2 F (36.8 C) (Oral)  Ht  (1.702 m)  Wt 130 lb 4 oz (59.081 kg)  BMI 20.40 kg/m2   GEN: WDWN, NAD, Non-toxic, Alert & Oriented x 3 HEENT: Atraumatic, Normocephalic.  Ears and Nose: No external deformity. EXTR: No clubbing/cyanosis/edema NEURO: Normal gait.  PSYCH: Normally interactive. Conversant. Not depressed or anxious appearing.  Calm demeanor.   Laboratory and Imaging Data: Results for orders placed or performed in visit on 06/15/14  RPR  Result Value Ref Range   RPR Ser Ql NON REAC NON REAC  HIV antibody  Result Value Ref Range   HIV 1&2 Ab, 4th Generation NONREACTIVE NONREACTIVE  GC/chlamydia probe amp, urine    Result Value Ref Range   Chlamydia, Swab/Urine, PCR NEGATIVE NEGATIVE   GC Probe Amp, Urine NEGATIVE NEGATIVE  Hepatitis B core antibody, IgM  Result Value Ref Range   Hep B C IgM NON REACTIVE NON REACTIVE  Hepatitis B surface antibody  Result Value Ref Range   Hep B S Ab POS (A) NEGATIVE  Hepatitis B surface antigen  Result Value Ref Range   Hepatitis B Surface Ag NEGATIVE NEGATIVE  Hepatitis C antibody  Result Value Ref Range   HCV Ab NEGATIVE NEGATIVE     Assessment and Plan:   Syphilis contact, untreated - Plan: RPR, HIV antibody, GC/chlamydia probe amp, urine, Hepatitis B core antibody, IgM, Hepatitis B surface antibody, Hepatitis B surface antigen, Hepatitis C antibody, penicillin g benzathine (BICILLIN LA) 1200000 UNIT/2ML injection 1.2 Million Units, penicillin g benzathine (BICILLIN LA) 1200000 UNIT/2ML injection 1.2 Million Units  Exposure to communicable disease - Plan: RPR, HIV antibody, GC/chlamydia probe amp, urine, Hepatitis B core antibody, IgM, Hepatitis B surface antibody, Hepatitis B surface antigen, Hepatitis C antibody  Per protocol, Bicillin LA.  He is RPR negative.  Follow-up: No Follow-up on file.  New Prescriptions   No medications on file   Orders Placed This Encounter  Procedures  . RPR  . HIV antibody  . GC/chlamydia probe amp, urine  . Hepatitis B core antibody, IgM  . Hepatitis B surface antibody  . Hepatitis B surface antigen  . Hepatitis C antibody    Signed,  Khali Albanese T. Janasha Barkalow, MD   Patient's Medications  New Prescriptions   No medications on file  Previous Medications   No medications on file  Modified Medications   No medications on file  Discontinued Medications   ACETAMINOPHEN (TYLENOL) 325 MG TABLET    Take 650 mg by mouth every 6 (six) hours as needed.     ONDANSETRON (ZOFRAN-ODT) 4 MG DISINTEGRATING TABLET    Take 1 tablet (4 mg total) by mouth every 8 (eight) hours as needed for nausea or vomiting.

## 2014-11-02 IMAGING — CT CT ABD-PELV W/ CM
2 of 4 series · 17 of 46 positions shown, 19 images · IV contrast (Omnipaque 300)
Comparison: None.

CLINICAL DATA: Family history of polycystic kidney disease;
hypertension.

CT ABDOMEN AND PELVIS WITH CONTRAST
TECHNIQUE: Multidetector CT imaging of the abdomen and pelvis was
performed following the standard protocol during bolus
administration of intravenous contrast.
Contrast: 100mL OMNIPAQUE IOHEXOL 300 MG/ML  SOLN

[Series 2: abd/ pel 5mm · axial · 0.56mm/px · z∈[-440,-66]mm · 14 of 83 slices shown, 16 images]
[im 4/83  soft-tissue]
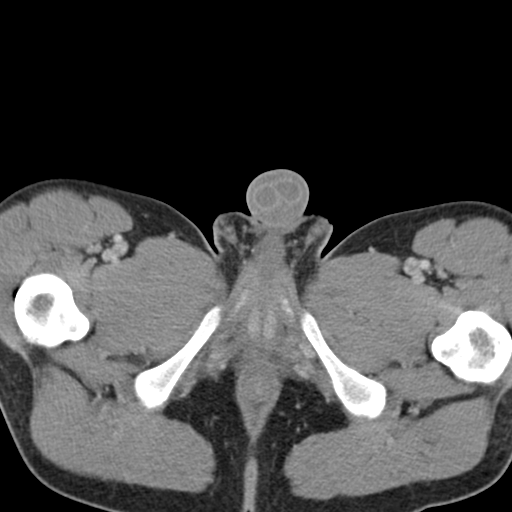
[im 4/83  bone]
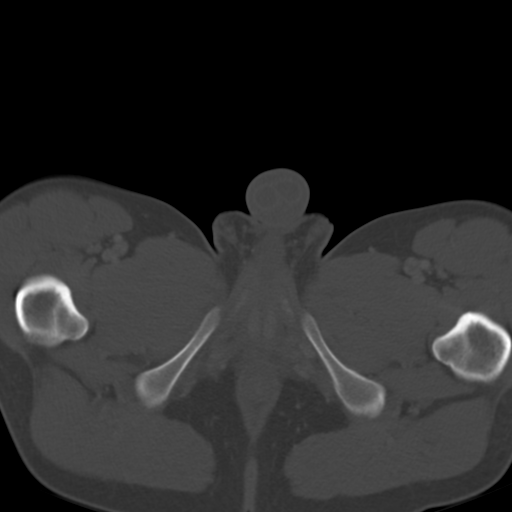
[im 10/83  soft-tissue]
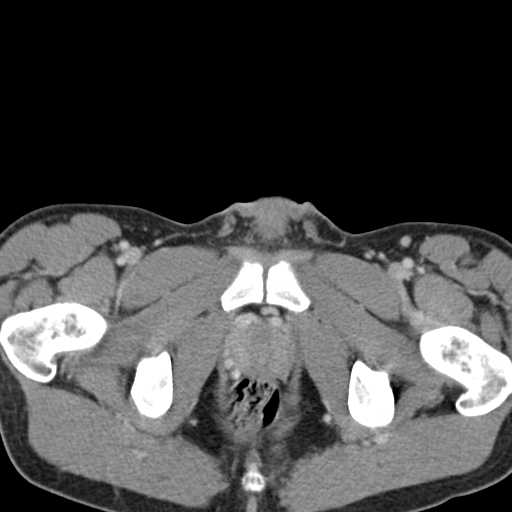
[im 16/83  soft-tissue]
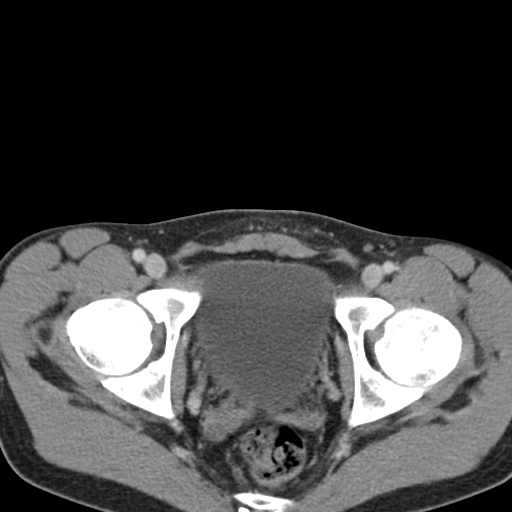
[im 23/83  soft-tissue]
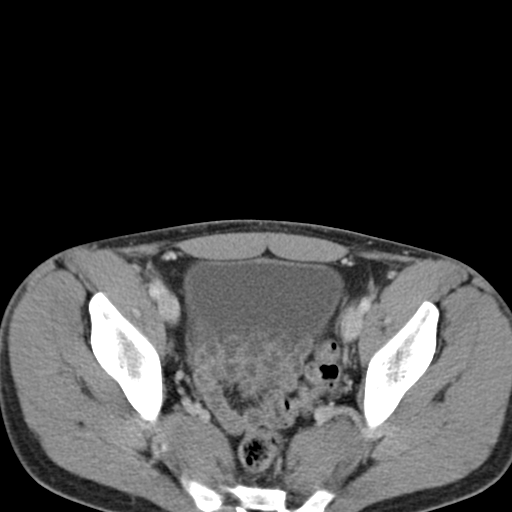
[im 29/83  soft-tissue]
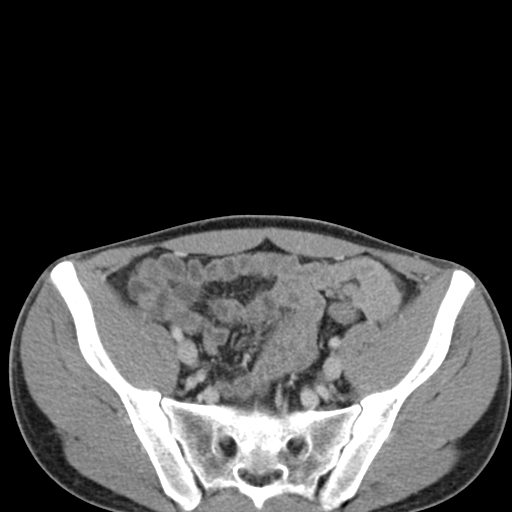
[im 32/83  soft-tissue]
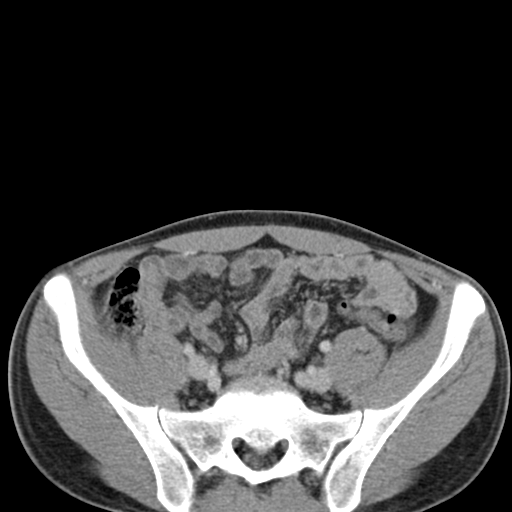
[im 38/83  soft-tissue]
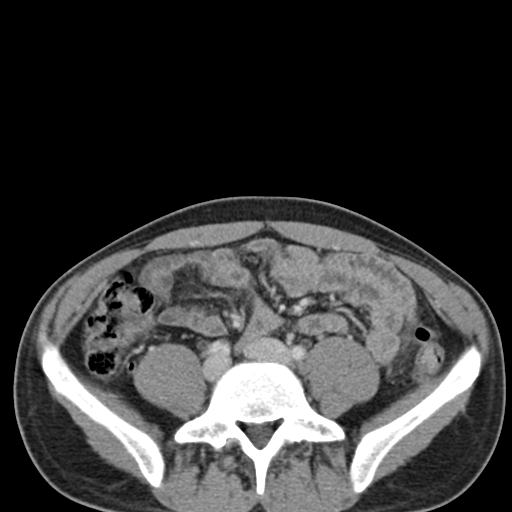
[im 45/83  soft-tissue]
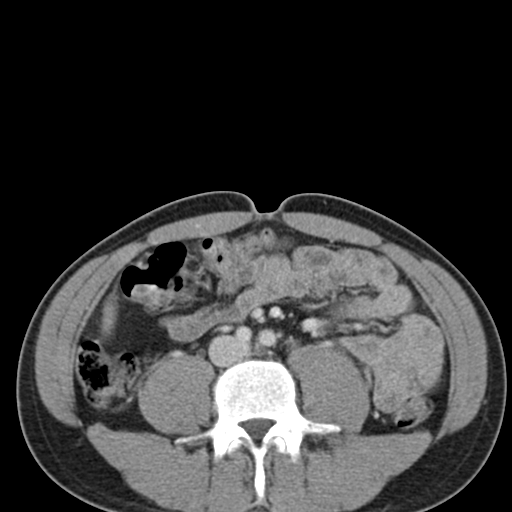
[im 51/83  soft-tissue]
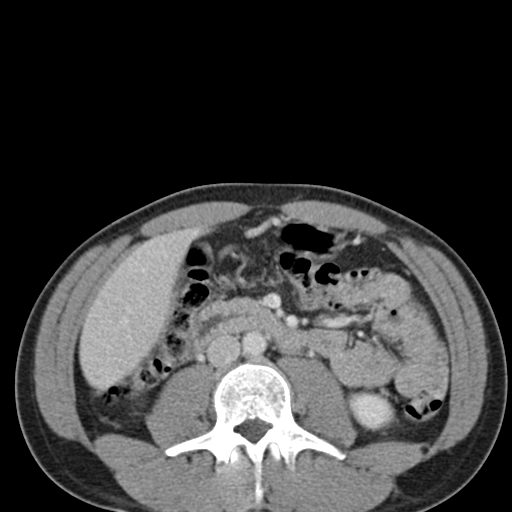
[im 51/83  bone]
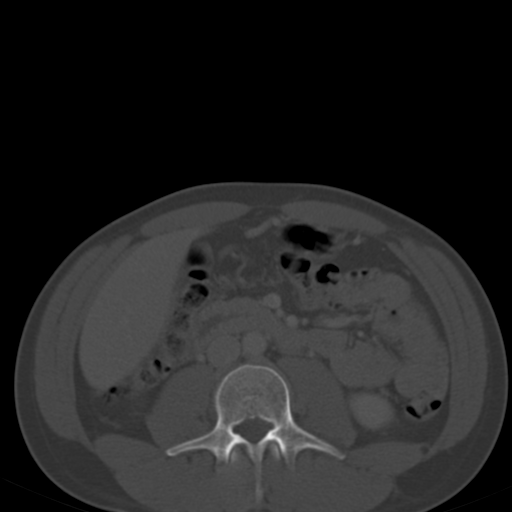
[im 54/83  soft-tissue]
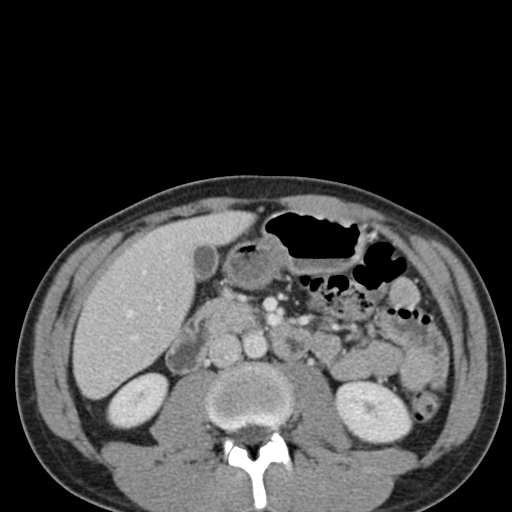
[im 60/83  soft-tissue]
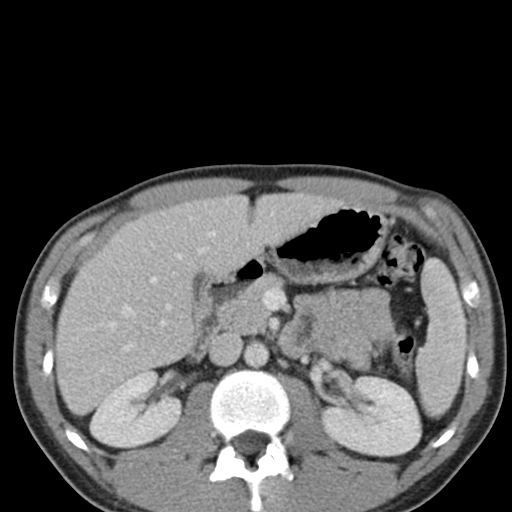
[im 67/83  soft-tissue]
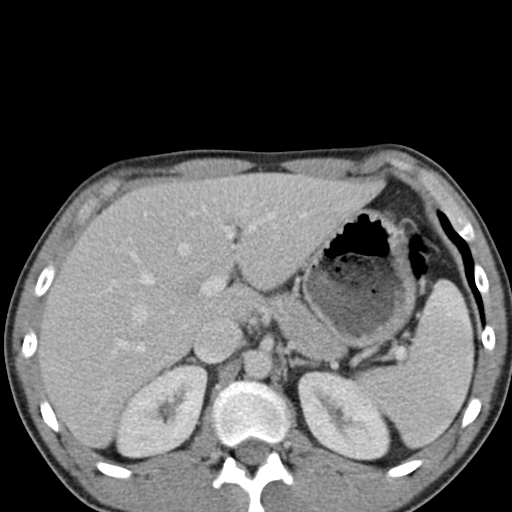
[im 73/83  soft-tissue]
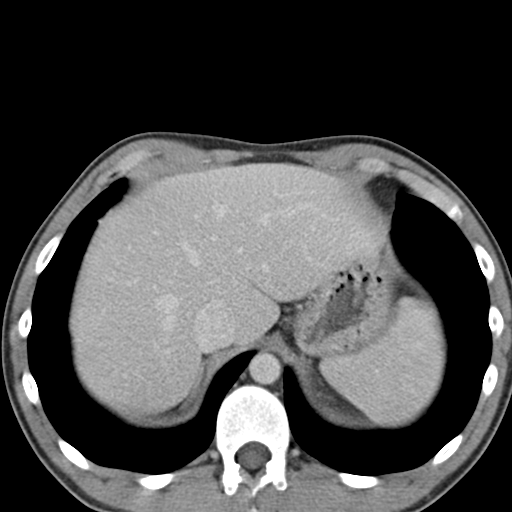
[im 79/83  soft-tissue]
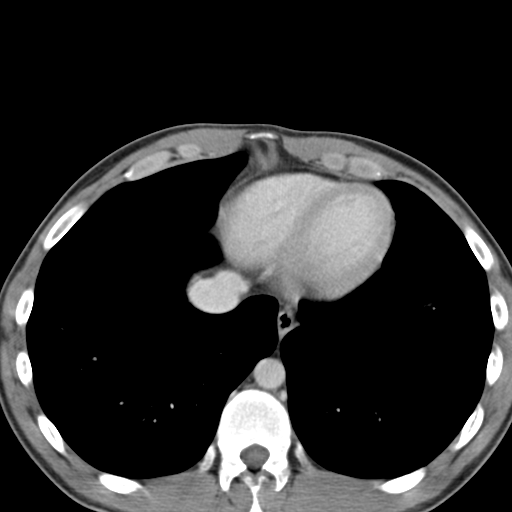

[Series 602: cor · coronal · 0.84mm/px · 3 of 108 slices shown]
[im 36/108  soft-tissue]
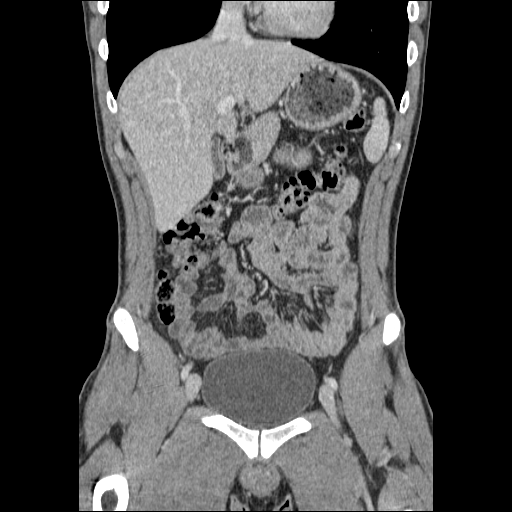
[im 48/108  soft-tissue]
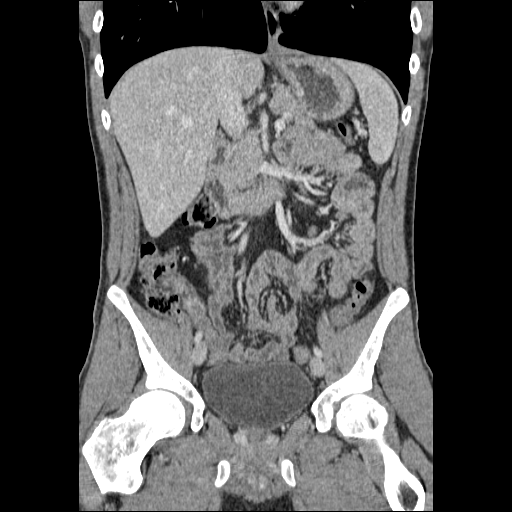
[im 60/108  soft-tissue]
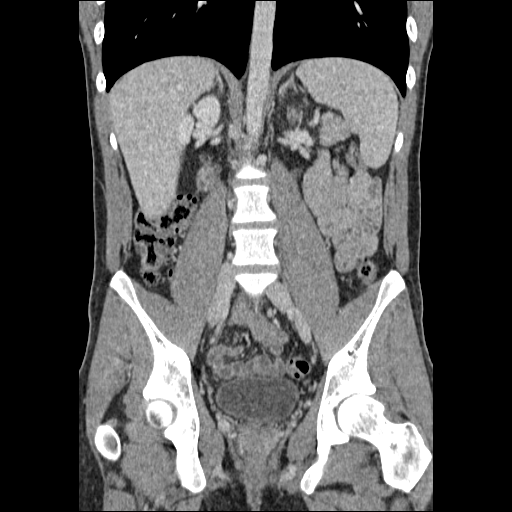

[17 of 46 positions shown; findings below may reference images not displayed]

FINDINGS: Visualized lung bases clear.  Unremarkable liver,
nondilated gallbladder, spleen, pancreas, adrenal glands, kidneys.
No renal cysts or other lesions evident.  No hydronephrosis.
Portal vein patent.  Aorta unremarkable.  Stomach, small bowel, and
colon are nondilated.  Normal appendix.  Urinary bladder
physiologically distended.  No ascites.  No adenopathy.
IMPRESSION: 1.  Unremarkable CT abdomen.  No evidence of polycystic disease.

## 2014-11-15 ENCOUNTER — Telehealth: Payer: Self-pay

## 2014-11-15 NOTE — Telephone Encounter (Signed)
error 

## 2015-06-29 ENCOUNTER — Telehealth: Payer: Self-pay | Admitting: Family Medicine

## 2015-06-29 ENCOUNTER — Ambulatory Visit (INDEPENDENT_AMBULATORY_CARE_PROVIDER_SITE_OTHER): Payer: 59 | Admitting: Internal Medicine

## 2015-06-29 VITALS — BP 130/70 | HR 72 | Temp 98.0°F | Resp 18 | Ht 67.0 in | Wt 134.0 lb

## 2015-06-29 DIAGNOSIS — H53132 Sudden visual loss, left eye: Secondary | ICD-10-CM

## 2015-06-29 NOTE — Progress Notes (Signed)
Subjective:  By signing my name below, I, Rawaa Al Rifaie, attest that this documentation has been prepared under the direction and in the presence of Ellamae Sia, MD.  Watt Climes Rifaie, Medical Scribe. 06/29/2015.  3:07 PM.   Patient ID: John Silva, male    DOB: 1991/04/20, 24 y.o.   MRN: [id number]  Chief Complaint  Patient presents with  . Eye Pain    Left eye, itchy, watery  . Headache  . Dental Pain    HPI HPI Comments: John Silva is a 25 y.o. male who presents to [facility] complaining of waxing and waning left eye pain, onset today after waking up in the morning around 9 am.  Pt reports that the eye was irritated as it there was an "eye lash" in the eye. He took off his contacts, however the pain did not resolve. Pt notes that his eye is watery and has itchiness, erythema, with associated photophobia, soreness in the area around the eye, and headache. Pt states that he cannot currently focus on an image therefore his vision is blurry and fuzzy unlike baseline where is he is able to distinguish objects and images. Pt normally follows up with Dr. Wilkie Aye. He denies feeling a spot in the eye.   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    Patient Active Problem List   Diagnosis Date Noted  . Alcoholism (HCC) 08/02/2013  . Elevated blood pressure reading without diagnosis of hypertension 05/21/2012  . ASTHMA, CHILDHOOD 09/03/2009  . ACROMIOCLAVICULAR JOINT SEPARATION, RIGHT 09/03/2009   Past Medical History  Diagnosis Date  . Childhood asthma   . Keloid scar   . History of chicken pox   . Acromioclavicular joint separation     right  . Substance abuse    History reviewed. No pertinent past surgical history. No Known Allergies Prior to Admission medications   Not on File   Social History   Social History  . Marital Status: Single    Spouse Name: N/A  . Number of Children: N/A  . Years of Education: N/A    Occupational History  . Not on file.   Social History Main Topics  . Smoking status: Former Games developer  . Smokeless tobacco: Current User  . Alcohol Use: Yes     Comment: Regular  . Drug Use: No  . Sexual Activity: Not on file   Other Topics Concern  . Not on file   Social History Narrative    Review of Systems  HENT: Positive for dental problem.   Eyes: Positive for photophobia, pain, redness and itching.  Neurological: Positive for headaches.      Objective:   Physical Exam  Constitutional: He is oriented to person, place, and time. He appears well-developed and well-nourished. No distress.  HENT:  Head: Normocephalic and atraumatic.  Eyes: Conjunctivae and EOM are normal. Pupils are equal, round, and reactive to light.  No recognition on images in the left eye.  Light reflex intact.  Fudoscopic shows pale retina but no clear defect.  Periorbital has no rash, though the lid is slightly swollen.  Conjunctiva mild injection.  Opthaine plus stain reveals no corneal defect or foreign body.   Neck: Neck supple.  Cardiovascular: Normal rate.   Pulmonary/Chest: Effort normal.  Neurological: He is alert and oriented to person, place, and time. No cranial nerve deficit.  Skin: Skin is warm and dry.  Psychiatric: He has a normal mood  and affect. His behavior is normal.  Nursing note and vitals reviewed.   BP 130/70 mmHg  Pulse 72  Temp(Src) 98 F (36.7 C) (Oral)  Resp 18  Ht  (1.702 m)  Wt 134 lb (60.782 kg)  BMI 20.98 kg/m2  SpO2 98%     Assessment & Plan:  1. Eye pain abrupt onset. 2. Sudden and persistent loss of vision.    Will refer for emergency opthalmology. Need to r/o Central artery occlusion and acute angle glaucoma. To see dr Maple Hudson now!!!   I have completed the patient encounter in its entirety as documented by the scribe, with editing by me where necessary. Raheem Kolbe P. Merla Riches, M.D.

## 2015-06-29 NOTE — Telephone Encounter (Signed)
 [facility] Day - Client TELEPHONE ADVICE RECORD [facility] Patient Name: John Silva DOB: 23-Mar-1991 Initial Comment Caller says he either has something in his eye, or it is infected; woke up with rt eye watering a lot, swollen, red, and vision is blurry, and can't see out of it when he focuses on something Nurse Assessment Nurse: Roderic Ovens, RN, Amy Date/Time (Eastern Time): 06/29/2015 12:02:50 PM Confirm and document reason for call. If symptomatic, describe symptoms. You must click the next button to save text entered. ---CALLER STATES THAT HE HAS SOMETHING IN HIS EYE OR MIGHT BE INFECTED. WATERING, RED, DOUBLE, BLURRY, CANT SEE OUT OF THE EYE. LEFT EYE IS THE PROBLEM AREA. NO FEVER. SINUSES ARE DRAINAGE. PAIN BEHIND THE EYE. Has the patient traveled out of the country within the last 30 days? ---Not Applicable Does the patient have any new or worsening symptoms? ---Yes Will a triage be completed? ---Yes Related visit to physician within the last 2 weeks? ---No Does the PT have any chronic conditions? (i.e. diabetes, asthma, etc.) ---No Is this a behavioral health or substance abuse call? ---No Guidelines Guideline Title Affirmed Question Affirmed Notes Eye - Swelling [1] SEVERE eyelid swelling on one side AND [2] sinus pain or pressure Final Disposition User See Physician within 4 Hours (or PCP triage) [place], RN, Amy Comments unable to get an appt - all full on schedule. attempted to try a different location as well. Referrals REFERRED TO PCP OFFICE Disagree/Comply: Comply

## 2015-06-29 NOTE — Telephone Encounter (Signed)
Pt at [facility] walk in clinic now.

## 2015-08-17 ENCOUNTER — Ambulatory Visit (INDEPENDENT_AMBULATORY_CARE_PROVIDER_SITE_OTHER): Payer: 59 | Admitting: Family Medicine

## 2015-08-17 ENCOUNTER — Encounter: Payer: Self-pay | Admitting: Family Medicine

## 2015-08-17 VITALS — BP 161/112 | HR 86 | Temp 98.4°F | Ht 67.0 in | Wt 135.0 lb

## 2015-08-17 DIAGNOSIS — F1021 Alcohol dependence, in remission: Secondary | ICD-10-CM

## 2015-08-17 DIAGNOSIS — R9431 Abnormal electrocardiogram [ECG] [EKG]: Secondary | ICD-10-CM | POA: Diagnosis not present

## 2015-08-17 DIAGNOSIS — F1020 Alcohol dependence, uncomplicated: Secondary | ICD-10-CM | POA: Diagnosis not present

## 2015-08-17 DIAGNOSIS — I10 Essential (primary) hypertension: Secondary | ICD-10-CM

## 2015-08-17 LAB — CBC WITH DIFFERENTIAL/PLATELET
Basophils Absolute: 0 10*3/uL (ref 0.0–0.1)
Basophils Relative: 0.3 % (ref 0.0–3.0)
EOS PCT: 1.3 % (ref 0.0–5.0)
Eosinophils Absolute: 0.1 10*3/uL (ref 0.0–0.7)
HCT: 44.7 % (ref 39.0–52.0)
HEMOGLOBIN: 15.4 g/dL (ref 13.0–17.0)
LYMPHS ABS: 0.9 10*3/uL (ref 0.7–4.0)
Lymphocytes Relative: 14.1 % (ref 12.0–46.0)
MCHC: 34.4 g/dL (ref 30.0–36.0)
MCV: 88.7 fl (ref 78.0–100.0)
MONO ABS: 0.5 10*3/uL (ref 0.1–1.0)
Monocytes Relative: 8 % (ref 3.0–12.0)
NEUTROS PCT: 76.3 % (ref 43.0–77.0)
Neutro Abs: 4.8 10*3/uL (ref 1.4–7.7)
Platelets: 189 10*3/uL (ref 150.0–400.0)
RBC: 5.05 Mil/uL (ref 4.22–5.81)
RDW: 13.4 % (ref 11.5–15.5)
WBC: 6.3 10*3/uL (ref 4.0–10.5)

## 2015-08-17 LAB — COMPREHENSIVE METABOLIC PANEL
ALBUMIN: 4.9 g/dL (ref 3.5–5.2)
ALK PHOS: 53 U/L (ref 39–117)
ALT: 14 U/L (ref 0–53)
AST: 19 U/L (ref 0–37)
BUN: 8 mg/dL (ref 6–23)
CALCIUM: 9.9 mg/dL (ref 8.4–10.5)
CHLORIDE: 102 meq/L (ref 96–112)
CO2: 31 mEq/L (ref 19–32)
CREATININE: 1.06 mg/dL (ref 0.40–1.50)
GFR: 90.14 mL/min (ref >60.00)
Glucose, Bld: 98 mg/dL (ref 70–99)
POTASSIUM: 4.4 meq/L (ref 3.5–5.1)
Sodium: 139 mEq/L (ref 135–145)
TOTAL PROTEIN: 7.6 g/dL (ref 6.0–8.3)
Total Bilirubin: 0.9 mg/dL (ref 0.2–1.2)

## 2015-08-17 LAB — POC URINALSYSI DIPSTICK (AUTOMATED)
BILIRUBIN UA: NEGATIVE
Blood, UA: NEGATIVE
GLUCOSE UA: NEGATIVE
Ketones, UA: NEGATIVE
Leukocytes, UA: NEGATIVE
Nitrite, UA: NEGATIVE
Protein, UA: NEGATIVE
SPEC GRAV UA: 1.015
Urobilinogen, UA: 0.2
pH, UA: 7

## 2015-08-17 LAB — TSH: TSH: 2.58 u[IU]/mL (ref 0.35–4.50)

## 2015-08-17 MED ORDER — LOSARTAN POTASSIUM 50 MG PO TABS: 50.0000 mg | ORAL_TABLET | Freq: Every day | ORAL | Status: DC

## 2015-08-17 NOTE — Progress Notes (Signed)
Pre visit review using our clinic review tool, if applicable. No additional management support is needed unless otherwise documented below in the visit note. 

## 2015-08-17 NOTE — Patient Instructions (Addendum)
Stop at front desk on way out to set up ECHO.  Stop at lab on way out.  Get BP cuff, follow BP daily.  Start losartan daily.

## 2015-08-17 NOTE — Progress Notes (Signed)
   Subjective:    Patient ID: John Silva, male    DOB: [birth year], 25 y.o.   MRN: [id number]  HPI  25 year old with history of blood pressure elevation  presents with recent elevated BP.  He reports that he noted headaches regularly.Marland Kitchen. Few weeks ago noted BP was  170/120. Rested some and on recheck it was 162/118.  Since then daily checks have been averaging 155/110. He does have fatigue, no SOB, no chest pain. Occ heart beats fast or skips beats x 4 month off and on. Feels hotter than others. Occ feels shaky and anxious, no panic attack.  He does drink 4-5 Mt Dews a day.  Body mass index is 21.14 kg/(m^2).  BP Readings from Last 3 Encounters:  08/17/15 161/112  06/29/15 130/70  06/15/14 120/80    Social History /Family History/Past Medical History reviewed and updated if needed. HTN in sister, mother, father.. Developed at early age, 8528 for sister.  He is an alcoholic.. But in remission for 2 years. Review of Systems  Constitutional: Negative for fever and fatigue.  HENT: Negative for ear pain.   Eyes: Negative for pain.  Respiratory: Negative for cough and shortness of breath.   Cardiovascular: Negative for chest pain, palpitations and leg swelling.  Gastrointestinal: Negative for abdominal pain.  Genitourinary: Negative for dysuria.  Musculoskeletal: Negative for arthralgias.  Neurological: Negative for syncope, light-headedness and headaches.  Psychiatric/Behavioral: Negative for dysphoric mood.       Objective:   Physical Exam  Constitutional: Vital signs are normal. He appears well-developed and well-nourished.  HENT:  Head: Normocephalic.  Right Ear: Hearing normal.  Left Ear: Hearing normal.  Nose: Nose normal.  Mouth/Throat: Oropharynx is clear and moist and mucous membranes are normal.  Neck: Trachea normal. Carotid bruit is not present. No thyroid mass and no thyromegaly present.  Cardiovascular: Normal rate, regular rhythm and normal pulses.  Exam  reveals no gallop, no distant heart sounds and no friction rub.   No murmur heard. No peripheral edema  Pulmonary/Chest: Effort normal and breath sounds normal. No respiratory distress.  Skin: Skin is warm, dry and intact. No rash noted.  Psychiatric: He has a normal mood and affect. His speech is normal and behavior is normal. Thought content normal.          Assessment & Plan:

## 2015-08-20 ENCOUNTER — Encounter: Payer: Self-pay | Admitting: *Deleted

## 2015-08-22 ENCOUNTER — Other Ambulatory Visit: Payer: Self-pay

## 2015-08-22 ENCOUNTER — Ambulatory Visit (INDEPENDENT_AMBULATORY_CARE_PROVIDER_SITE_OTHER): Payer: 59

## 2015-08-22 DIAGNOSIS — I10 Essential (primary) hypertension: Secondary | ICD-10-CM

## 2015-08-27 ENCOUNTER — Ambulatory Visit (INDEPENDENT_AMBULATORY_CARE_PROVIDER_SITE_OTHER): Payer: 59 | Admitting: Family Medicine

## 2015-08-27 ENCOUNTER — Encounter: Payer: Self-pay | Admitting: Family Medicine

## 2015-08-27 VITALS — BP 140/80 | HR 72 | Temp 97.8°F | Ht 67.0 in | Wt 136.0 lb

## 2015-08-27 DIAGNOSIS — I10 Essential (primary) hypertension: Secondary | ICD-10-CM | POA: Diagnosis not present

## 2015-08-27 NOTE — Progress Notes (Signed)
Dr. Karleen HampshireSpencer T. Donovan Persley, MD, CAQ Sports Medicine [facility] [address] [place], [address] Phone: [phone] Fax: [phone]  08/27/2015  Patient: John Silva, MRN: [id number], DOB: 02-Nov-1990, 25 y.o.  Primary Physician:  Hannah BeatSpencer Magon Croson, MD   Chief Complaint  Patient presents with  . Hypertension   Subjective:   John Silva is a 25 y.o. very pleasant male patient who presents with the following:  HTN: Tolerating all medications without side effects Stable and at goal No CP, no sob. No HA.  BP Readings from Last 3 Encounters:  08/27/15 140/80  08/17/15 161/112  06/29/15 130/70   Basic Metabolic Panel:    Component Value Date/Time   NA 139 08/17/2015 1322   K 4.4 08/17/2015 1322   CL 102 08/17/2015 1322   CO2 31 08/17/2015 1322   BUN 8 08/17/2015 1322   CREATININE 1.06 08/17/2015 1322   GLUCOSE 98 08/17/2015 1322   CALCIUM 9.9 08/17/2015 1322   Dipping.     Past Medical History, Surgical History, Social History, Family History, Problem List, Medications, and Allergies have been reviewed and updated if relevant.  Patient Active Problem List   Diagnosis Date Noted  . Essential hypertension, benign 08/17/2015  . Alcoholism (HCC) 08/02/2013  . ASTHMA, CHILDHOOD 09/03/2009  . ACROMIOCLAVICULAR JOINT SEPARATION, RIGHT 09/03/2009    Past Medical History  Diagnosis Date  . Childhood asthma   . Keloid scar   . History of chicken pox   . Acromioclavicular joint separation     right  . Substance abuse     No past surgical history on file.  Social History   Social History  . Marital Status: Single    Spouse Name: N/A  . Number of Children: N/A  . Years of Education: N/A   Occupational History  . Not on file.   Social History Main Topics  . Smoking status: Former Games developermoker  . Smokeless tobacco: Current User  . Alcohol Use: No     Comment: Regular  . Drug Use: No  . Sexual Activity: Not on file   Other  Topics Concern  . Not on file   Social History Narrative    Family History  Problem Relation Age of Onset  . Alcohol abuse Maternal Grandfather     Family history  . Arthritis      Family history  . Hyperlipidemia      Family history  . Hypertension      Family history  . Lung cancer      Family history  . Polycystic kidney disease Cousin     1st cousin once removed  . Polycystic kidney disease Cousin     1st cousin once removed  . Polycystic kidney disease Cousin     1st cousin once removed  . Hyperlipidemia Sister     No Known Allergies  Medication list reviewed and updated in full in [place] Link.   GEN: No acute illnesses, no fevers, chills. GI: No n/v/d, eating normally Pulm: No SOB Interactive and getting along well at home.  Otherwise, ROS is as per the HPI.  Objective:   BP 140/80 mmHg  Pulse 72  Temp(Src) 97.8 F (36.6 C)  Ht 5\' 7"  (1.702 m)  Wt 136 lb (61.689 kg)  BMI 21.30 kg/m2  SpO2 99%  GEN: WDWN, NAD, Non-toxic, A & O x 3 HEENT: Atraumatic, Normocephalic. Neck supple. No masses, No LAD. Ears and Nose: No external deformity. CV:  RRR, No M/G/R. No JVD. No thrill. No extra heart sounds. PULM: CTA B, no wheezes, crackles, rhonchi. No retractions. No resp. distress. No accessory muscle use. EXTR: No c/c/e NEURO Normal gait.  PSYCH: Normally interactive. Conversant. Not depressed or anxious appearing.  Calm demeanor.   Laboratory and Imaging Data:  Assessment and Plan:   Essential hypertension, benign  Blood pressure doing much better on this dose of Cozaar.  He is also been eating better and decrease his salt intake.  We're going to recheck this in the fall at his physical.  Follow-up: 6 months for CPX  Signed,  Genni Buske T. Naeem Quillin, MD   Patient's Medications  New Prescriptions   No medications on file  Previous Medications   LOSARTAN (COZAAR) 50 MG TABLET    Take 1 tablet (50 mg total) by mouth daily.  Modified Medications     No medications on file  Discontinued Medications   No medications on file

## 2015-08-28 ENCOUNTER — Encounter: Payer: Self-pay | Admitting: Family Medicine

## 2015-09-04 DIAGNOSIS — R9431 Abnormal electrocardiogram [ECG] [EKG]: Secondary | ICD-10-CM | POA: Insufficient documentation

## 2015-09-04 NOTE — Assessment & Plan Note (Signed)
No current ETOH use per pt.

## 2015-09-04 NOTE — Assessment & Plan Note (Signed)
Possible LVH on EKG. Refer for ECHO to eval further.

## 2015-09-04 NOTE — Assessment & Plan Note (Signed)
Eval for secondary cause, end orgfan damage and risk fators. eval with EKG, UA and labs. Work on low Na DASH diet. Start losartan, recheck Cr in7-10 days  After initiation. Follow BP at home.

## 2015-09-05 ENCOUNTER — Other Ambulatory Visit (HOSPITAL_COMMUNITY): Payer: 59

## 2015-11-05 ENCOUNTER — Telehealth: Payer: Self-pay

## 2015-11-05 NOTE — Telephone Encounter (Signed)
Can you bring him in to talk about it - ideally between 8 and 9 AM?

## 2015-11-05 NOTE — Telephone Encounter (Addendum)
Pt left /vm; pt taking losartan and pt wants to know if OK to take certain vitamins or supplements. Left v/m requesting pt to cb. Pt called back and has no sex drive and no energy level. Pt [facility] for testosterone supplement; helps libido and energy level. Pt wants to know if safe and OK to take with losartan.Pt request cb.

## 2015-11-05 NOTE — Telephone Encounter (Signed)
Cali notified as instructed by telephone.  He wants to know what he can do for his low sex drive.  He states this has become more noticeable since starting the Losartan.  Please advise.

## 2015-11-05 NOTE — Telephone Encounter (Signed)
Appointment scheduled for 11/12/15 at 9:30 am with Dr. Patsy Lageropland.

## 2015-11-05 NOTE — Telephone Encounter (Signed)
I would not take any testosterone boosters or pro-hormones. Most of them are older oral steroids or slightly altered older oral steroids. Possible impact to liver.   I never tell anyone to take them.

## 2015-11-12 ENCOUNTER — Ambulatory Visit: Payer: 59 | Admitting: Family Medicine

## 2015-11-19 ENCOUNTER — Ambulatory Visit: Payer: 59 | Admitting: Family Medicine

## 2016-03-06 ENCOUNTER — Telehealth: Payer: Self-pay | Admitting: *Deleted

## 2016-03-06 NOTE — Telephone Encounter (Signed)
[facility]artan.  PA completed on CoverMyMeds.  Awaiting decision.

## 2016-03-10 NOTE — Telephone Encounter (Addendum)
Received fax from [facility] that states Losartan is on patient's plan of covered drugs.  PA is not required.

## 2019-01-12 ENCOUNTER — Ambulatory Visit: Payer: 59 | Admitting: Family Medicine

## 2019-01-12 ENCOUNTER — Encounter: Payer: Self-pay | Admitting: Family Medicine

## 2019-01-12 ENCOUNTER — Ambulatory Visit (INDEPENDENT_AMBULATORY_CARE_PROVIDER_SITE_OTHER): Payer: 59 | Admitting: Family Medicine

## 2019-01-12 VITALS — BP 153/99 | HR 86 | Temp 98.8°F | Ht 67.0 in | Wt 137.0 lb

## 2019-01-12 DIAGNOSIS — Z720 Tobacco use: Secondary | ICD-10-CM | POA: Insufficient documentation

## 2019-01-12 DIAGNOSIS — F909 Attention-deficit hyperactivity disorder, unspecified type: Secondary | ICD-10-CM

## 2019-01-12 MED ORDER — METHYLPHENIDATE HCL ER (OSM) 18 MG PO TBCR: 18.0000 mg | EXTENDED_RELEASE_TABLET | ORAL | 0 refills | Status: DC

## 2019-01-12 NOTE — Progress Notes (Addendum)
Kanyon Seibold T. Antonia Culbertson, MD Primary Care and [place] at [facility] [place], [address] Phone: [phone]  FAX: [place] - 28 y.o. male  MRN [id number]  Date of Birth: [birth year]  Visit Date: 01/12/2019  PCP: Owens Loffler, MD  Referred by: Owens Loffler, MD Chief Complaint  Patient presents with  . ADHD   Virtual Visit via Video Note:  I connected with  John Silva on 01/12/2019 11:40 AM EDT by a video enabled telemedicine application and verified that I am speaking with the correct person using two identifiers.   Location patient: home computer, tablet, or smartphone Location provider: work or home office Consent: Verbal consent directly obtained from [name]. Persons participating in the virtual visit: patient, provider  I discussed the limitations of evaluation and management by telemedicine and the availability of in person appointments. The patient expressed understanding and agreed to proceed.  Interactive audio and video telecommunications were attempted between this provider and patient, however failed, due to patient having technical difficulties OR patient did not have access to video capability.  We continued and completed visit with audio only.     History of Present Illness:  New video, ADHD.    We had a long talk about ADHD, and he did use stimulants from the age of [age], and then he chose to stop them.  He is having some difficulty now dealing and doing his work.  He is also now been sober for over 5 years from alcohol.  In the past he also took some Strattera along with his stimulants.  They did do pretty well, but they did have some concern about him losing weight.  Review of Systems as above: See pertinent positives and pertinent negatives per HPI No acute distress verbally  Past Medical History, Surgical History, Social History, Family History, Problem  List, Medications, and Allergies have been reviewed and updated if relevant.   Observations/Objective/Exam:  An attempt was made to discern vital signs over the phone and per patient if applicable and possible.   Addendum, all conversation done over the telephone.  Assessment and Plan:    ICD-10-CM   1. Attention deficit hyperactivity disorder (ADHD), unspecified ADHD type  F90.9    >15 minutes spent in face to face time with patient, >50% spent in counselling or coordination of care   Previously diagnosed, think is reasonable to go on some low-dose stimulants to see how this does.  We will start him on intro dose of Concerta.  His wife is going to have control of all of the medication and dispense at all times.  The patient is not planning to know when they are in his house.  I discussed the assessment and treatment plan with the patient. The patient was provided an opportunity to ask questions and all were answered. The patient agreed with the plan and demonstrated an understanding of the instructions.   The patient was advised to call back or seek an in-person evaluation if the symptoms worsen or if the condition fails to improve as anticipated.  Follow-up: prn unless noted otherwise below No follow-ups on file.  Meds ordered this encounter  Medications  . methylphenidate (CONCERTA) 18 MG PO CR tablet    Sig: Take 1 tablet (18 mg total) by mouth every morning.    Dispense:  30 tablet    Refill:  0   No orders of the defined types were  placed in this encounter.   Signed,  Elpidio GaleaSpencer T. Kashawna Manzer, MD

## 2019-01-14 ENCOUNTER — Telehealth: Payer: Self-pay

## 2019-01-14 ENCOUNTER — Encounter: Payer: Self-pay | Admitting: Family Medicine

## 2019-01-14 DIAGNOSIS — F909 Attention-deficit hyperactivity disorder, unspecified type: Secondary | ICD-10-CM

## 2019-01-14 NOTE — Telephone Encounter (Signed)
Needs to await PCP input as controlled substance.

## 2019-01-14 NOTE — Telephone Encounter (Signed)
Pt had virtual visit on 54/36/06 and took concerta 18 mg on 77/03/40. Pt did well until 3:30 pm and then pt knew he had no med in his system; pt could not focus, poor attention span and not able to complete task. Pt has a second job and struggled last night to do work. Pt does not want to struggle over the long weekend and wants to know what can be done so the effects of concerta does not wear off at 3:30 PM. Leda [facility].

## 2019-01-15 NOTE — Telephone Encounter (Signed)
There is a telephone note in the chart that was sent to Dr Lorelei Pont also. Thank you

## 2019-01-16 NOTE — Telephone Encounter (Signed)
Please call  This was started a few days ago.  I don't want to increase dose yet, and would go at least 2-3 weeks before making any changes.

## 2019-01-18 MED ORDER — LISDEXAMFETAMINE DIMESYLATE 30 MG PO CAPS: 30.0000 mg | ORAL_CAPSULE | Freq: Every day | ORAL | 0 refills | Status: DC

## 2019-01-18 NOTE — Telephone Encounter (Signed)
See My Chart Message.

## 2019-02-02 ENCOUNTER — Encounter: Payer: Self-pay | Admitting: Family Medicine

## 2019-02-02 MED ORDER — LISDEXAMFETAMINE DIMESYLATE 40 MG PO CAPS: 40.0000 mg | ORAL_CAPSULE | Freq: Every day | ORAL | 0 refills | Status: DC

## 2019-02-03 MED ORDER — ATOMOXETINE HCL 40 MG PO CAPS: 40.0000 mg | ORAL_CAPSULE | Freq: Every day | ORAL | 0 refills | Status: DC

## 2019-02-03 NOTE — Addendum Note (Signed)
Addended by: Owens Loffler on: 02/03/2019 08:47 AM   Modules accepted: Orders

## 2019-02-26 ENCOUNTER — Other Ambulatory Visit: Payer: Self-pay | Admitting: Family Medicine

## 2019-02-28 NOTE — Telephone Encounter (Signed)
Last office visit 01/12/2019 for ADHD.  Last refilled 02/03/2019 for #30 with no refills.  No future appointments.

## 2019-03-01 ENCOUNTER — Encounter: Payer: Self-pay | Admitting: Family Medicine

## 2019-03-02 MED ORDER — LISDEXAMFETAMINE DIMESYLATE 50 MG PO CAPS: 50.0000 mg | ORAL_CAPSULE | ORAL | 0 refills | Status: DC

## 2019-03-02 NOTE — Telephone Encounter (Signed)
I agree with him - good idea

## 2019-03-30 ENCOUNTER — Encounter: Payer: Self-pay | Admitting: Family Medicine

## 2019-03-31 MED ORDER — LISDEXAMFETAMINE DIMESYLATE 50 MG PO CAPS: 50.0000 mg | ORAL_CAPSULE | ORAL | 0 refills | Status: DC

## 2019-05-02 ENCOUNTER — Encounter: Payer: Self-pay | Admitting: Family Medicine

## 2019-05-02 NOTE — Telephone Encounter (Signed)
Can you get him to set up a virtual visit?

## 2019-05-03 ENCOUNTER — Ambulatory Visit (INDEPENDENT_AMBULATORY_CARE_PROVIDER_SITE_OTHER): Payer: 59 | Admitting: Family Medicine

## 2019-05-03 ENCOUNTER — Other Ambulatory Visit: Payer: Self-pay

## 2019-05-03 DIAGNOSIS — F909 Attention-deficit hyperactivity disorder, unspecified type: Secondary | ICD-10-CM

## 2019-05-03 MED ORDER — ADDERALL 10 MG PO TABS: 10.0000 mg | ORAL_TABLET | Freq: Every day | ORAL | 0 refills | Status: DC

## 2019-05-03 MED ORDER — ADDERALL XR 20 MG PO CP24: 20.0000 mg | ORAL_CAPSULE | ORAL | 0 refills | Status: DC

## 2019-05-03 NOTE — Progress Notes (Signed)
John Riga T. John Donado, MD Primary Care and [place] at [facility] [place], [address] Phone: [phone]  FAX: [place] - 28 y.o. male  MRN [id number]  Date of Birth: 1991/04/10  Visit Date: 05/03/2019  PCP: Owens Loffler, MD  Referred by: Owens Loffler, MD No chief complaint on file.  Virtual Visit via Video Note:  I connected with  John Silva on 05/03/2019 11:00 AM EST by a video enabled telemedicine application and verified that I am speaking with the correct person using two identifiers.   Location patient: home computer, tablet, or smartphone Location provider: work or home office Consent: Verbal consent directly obtained from [name]. Persons participating in the virtual visit: patient, provider  I discussed the limitations of evaluation and management by telemedicine and the availability of in person appointments. The patient expressed understanding and agreed to proceed.  History of Present Illness:  F/u on ADHD.  He is a very nice young man who I know well.  He presents with a follow-up on ADHD.  He had been most recently on Vyvanse, and we tried him on Strattera in the afternoon.  Unfortunately he has lost about 20 pounds since starting Vyvanse a few months ago.  He has a decreased drive to eat.  He also has had some significant efficacy also.  He is also recently been on some Concerta, and had decreased efficacy with this as well  Wt Readings from Last 3 Encounters:  01/12/19 137 lb (62.1 kg)  08/27/15 136 lb (61.7 kg)  08/17/15 135 lb (61.2 kg)    Down to 119.    Review of Systems as above: See pertinent positives and pertinent negatives per HPI No acute distress verbally  Past Medical History, Surgical History, Social History, Family History, Problem List, Medications, and Allergies have been reviewed and updated if relevant.    Observations/Objective/Exam:  An attempt was made to discern vital signs over the phone and per patient if applicable and possible.   General:    Alert, Oriented, appears well and in no acute distress HEENT:     Atraumatic, conjunctiva clear, no obvious abnormalities on inspection of external nose and ears.  Neck:    Normal movements of the head and neck Pulmonary:     On inspection no signs of respiratory distress, breathing rate appears normal, no obvious gross SOB, gasping or wheezing Cardiovascular:    No obvious cyanosis Musculoskeletal:    Moves all visible extremities without noticeable abnormality Psych / Neurological:     Pleasant and cooperative, no obvious depression or anxiety, speech and thought processing grossly intact  Assessment and Plan:    ICD-10-CM   1. Attention deficit hyperactivity disorder (ADHD), unspecified ADHD type  F90.9    >25 minutes spent in face to face time with patient, >50% spent in counselling or coordination of care: Long conversations regarding various options for the treatment of ADHD.  We will go to a different class of medication and start with Adderall.  Conversion of equal doses of current treatment of Vyvanse has been completed.  My preference and his preference are for him to use these medications during the workday, but on the weekend and when he has a day off to not use them.  I discussed the assessment and treatment plan with the patient. The patient was provided an opportunity to ask questions and all were answered. The patient agreed with the plan  and demonstrated an understanding of the instructions.   The patient was advised to call back or seek an in-person evaluation if the symptoms worsen or if the condition fails to improve as anticipated.  Follow-up: prn unless noted otherwise below No follow-ups on file.  Meds ordered this encounter  Medications  . ADDERALL XR 20 MG 24 hr capsule    Sig: Take 1 capsule (20 mg total) by  mouth every morning.    Dispense:  30 capsule    Refill:  0  . ADDERALL 10 MG tablet    Sig: Take 1 tablet (10 mg total) by mouth daily in the afternoon.    Dispense:  30 tablet    Refill:  0   No orders of the defined types were placed in this encounter.   Signed,  Elpidio Galea. Natiya Seelinger, MD

## 2019-05-04 ENCOUNTER — Encounter: Payer: Self-pay | Admitting: Family Medicine

## 2019-05-04 NOTE — Telephone Encounter (Signed)
Left message asking pt to call office  °

## 2019-05-31 ENCOUNTER — Other Ambulatory Visit: Payer: Self-pay | Admitting: Family Medicine

## 2019-05-31 MED ORDER — ADDERALL 10 MG PO TABS: 10.0000 mg | ORAL_TABLET | Freq: Every day | ORAL | 0 refills | Status: DC

## 2019-05-31 MED ORDER — ADDERALL XR 20 MG PO CP24: 20.0000 mg | ORAL_CAPSULE | ORAL | 0 refills | Status: DC

## 2019-05-31 NOTE — Telephone Encounter (Signed)
Last office visit 05/03/2019 for ADHD.  Last refilled 05/03/2019 for #30 with no refills. No UDS/Contract on file.  No future appointments.

## 2019-06-30 ENCOUNTER — Other Ambulatory Visit: Payer: Self-pay | Admitting: Family Medicine

## 2019-06-30 NOTE — Telephone Encounter (Signed)
Last office visit 05/03/2019 for ADHD.  Last refilled 05/31/2019 for #30 with no refills. No UDS/Contract on file.  No future appointments.

## 2019-07-02 MED ORDER — ADDERALL 10 MG PO TABS: 10.0000 mg | ORAL_TABLET | Freq: Every day | ORAL | 0 refills | Status: DC

## 2019-07-02 MED ORDER — ADDERALL XR 20 MG PO CP24: 20.0000 mg | ORAL_CAPSULE | ORAL | 0 refills | Status: DC

## 2019-07-04 ENCOUNTER — Encounter: Payer: Self-pay | Admitting: Family Medicine

## 2019-07-04 DIAGNOSIS — F909 Attention-deficit hyperactivity disorder, unspecified type: Secondary | ICD-10-CM

## 2019-07-04 MED ORDER — AMPHETAMINE-DEXTROAMPHETAMINE 10 MG PO TABS: 10.0000 mg | ORAL_TABLET | Freq: Every day | ORAL | 0 refills | Status: DC

## 2019-07-04 MED ORDER — AMPHETAMINE-DEXTROAMPHET ER 20 MG PO CP24
20.0000 mg | ORAL_CAPSULE | ORAL | 0 refills | Status: DC
Start: 2019-07-04 — End: 2019-08-01

## 2019-07-04 NOTE — Telephone Encounter (Signed)
Looking at his medication list.  It is written for Brand Name with dispense as written.  I have change prescriptions.  Please sign.

## 2019-07-04 NOTE — Telephone Encounter (Signed)
Can you check on this. Typically I use the generic?

## 2019-07-04 NOTE — Telephone Encounter (Signed)
After hours nurse line msg from this weekend below. This has been addressed by the provider.   [place] [facility] Night - Client TELEPHONE ADVICE RECORD [facility] Patient Name: John Silva Gender: Male DOB: 1990-07-27 Age: 29 Y 4 M 1 D Return Phone Number: Address: City/State/Zip: [place] Client [facility] [place] Night - Client Client Site [facility] [place] - Night Physician Copland, Karleen Hampshire - MD Contact Type Call Who Is Calling Pharmacy Call Type Pharmacy Send to RN Chief Complaint Paging or Request for Consult Reason for Call Request to clarify medication order Initial Comment Caller Tobi Bastos with [facility] has a question regarding a medication- Adderall 10mg  that was electronically sent this morning. it was sent through as a DAW1 and is wondering if it can be resent as DAW0 so they can substitute generic Additional Comment [facility] Name [facility] Pharmacist Name Pharmacy Number [phone] Translation No Nurse Assessment Nurse: [phone], RN, Will Date/Time Oretha Ellis Time): 07/02/2019 9:42:04 AM Confirm and document reason for call. If symptomatic, describe symptoms. ---Caller is 07/04/2019 at [facility] to request that prescription be re sent to pharmacy so that can be dispensed as generic. Caller advised to contact office on Monday for script to be resent. Client directive followed. Has the patient had close contact with a person known or suspected to have the novel coronavirus illness OR traveled / lives in area with major community spread (including international travel) in the last 14 days from the onset of symptoms? * If Asymptomatic, screen for exposure and travel within the last 14 days. ---No Does the patient have any new or worsening symptoms? ---No Guidelines Guideline Title Affirmed Question Affirmed Notes Nurse Date/Time (Eastern Time) Disp. Time Wednesday Time) Disposition Final User 07/02/2019 9:47:55 AM  Pharmacy Call 07/04/2019, RN, Will Reason: Advised to call back Monday for prescription authorization. 07/02/2019 9:48:02 AM Clinical Call Yes 07/04/2019, RN, Will

## 2019-08-01 ENCOUNTER — Other Ambulatory Visit: Payer: Self-pay | Admitting: Family Medicine

## 2019-08-01 DIAGNOSIS — F909 Attention-deficit hyperactivity disorder, unspecified type: Secondary | ICD-10-CM

## 2019-08-02 NOTE — Telephone Encounter (Signed)
Last office visit 05/03/2019 for ADHD.  Last refilled 07/04/2019 for #30 with no refills.  No future appointments. 

## 2019-08-02 NOTE — Telephone Encounter (Signed)
Last office visit 05/03/2019 for ADHD.  Last refilled 07/04/2019 for #30 with no refills.  No future appointments.

## 2019-08-03 MED ORDER — AMPHETAMINE-DEXTROAMPHETAMINE 10 MG PO TABS: 10.0000 mg | ORAL_TABLET | Freq: Every day | ORAL | 0 refills | Status: DC

## 2019-08-03 MED ORDER — AMPHETAMINE-DEXTROAMPHET ER 20 MG PO CP24: 20.0000 mg | ORAL_CAPSULE | ORAL | 0 refills | Status: DC

## 2019-08-29 ENCOUNTER — Other Ambulatory Visit: Payer: Self-pay | Admitting: Family Medicine

## 2019-08-29 DIAGNOSIS — F909 Attention-deficit hyperactivity disorder, unspecified type: Secondary | ICD-10-CM

## 2019-08-30 MED ORDER — AMPHETAMINE-DEXTROAMPHETAMINE 10 MG PO TABS: 10.0000 mg | ORAL_TABLET | Freq: Every day | ORAL | 0 refills | Status: DC

## 2019-08-30 MED ORDER — AMPHETAMINE-DEXTROAMPHET ER 20 MG PO CP24: 20.0000 mg | ORAL_CAPSULE | ORAL | 0 refills | Status: DC

## 2019-08-30 NOTE — Telephone Encounter (Signed)
Last office visit 05/03/2019 for ADHD.  Last refilled 08/03/2019 for #30 with no refills.  No UDS/Contract on file.  No future appointments.

## 2019-09-27 ENCOUNTER — Other Ambulatory Visit: Payer: Self-pay | Admitting: Family Medicine

## 2019-09-27 DIAGNOSIS — F909 Attention-deficit hyperactivity disorder, unspecified type: Secondary | ICD-10-CM

## 2019-09-27 MED ORDER — AMPHETAMINE-DEXTROAMPHET ER 20 MG PO CP24: 20.0000 mg | ORAL_CAPSULE | ORAL | 0 refills | Status: DC

## 2019-09-27 MED ORDER — AMPHETAMINE-DEXTROAMPHETAMINE 10 MG PO TABS: 10.0000 mg | ORAL_TABLET | Freq: Every day | ORAL | 0 refills | Status: DC

## 2019-09-27 NOTE — Telephone Encounter (Signed)
Last office visit 05/03/2019 for ADHD. Last refilled 04/202/021 for #30 with no refills.  No UDS/Contract on file.  No future appointments.

## 2019-10-24 ENCOUNTER — Encounter: Payer: Self-pay | Admitting: Family Medicine

## 2019-10-24 ENCOUNTER — Other Ambulatory Visit: Payer: Self-pay | Admitting: Family Medicine

## 2019-10-24 DIAGNOSIS — F909 Attention-deficit hyperactivity disorder, unspecified type: Secondary | ICD-10-CM

## 2019-10-24 NOTE — Telephone Encounter (Signed)
Last office visit 05/03/2019 for ADHD.  Last refilled 09/27/2019 for #30 with no refills.  No future appointments.  No UDS/Contract on file.  Also see MyChart message patient sent today.

## 2019-10-25 MED ORDER — AMPHETAMINE-DEXTROAMPHETAMINE 20 MG PO TABS: 20.0000 mg | ORAL_TABLET | Freq: Every day | ORAL | 0 refills | Status: DC

## 2019-10-25 MED ORDER — AMPHETAMINE-DEXTROAMPHET ER 20 MG PO CP24: 20.0000 mg | ORAL_CAPSULE | ORAL | 0 refills | Status: DC

## 2019-11-23 ENCOUNTER — Other Ambulatory Visit: Payer: Self-pay | Admitting: Family Medicine

## 2019-11-23 DIAGNOSIS — F909 Attention-deficit hyperactivity disorder, unspecified type: Secondary | ICD-10-CM

## 2019-11-23 MED ORDER — AMPHETAMINE-DEXTROAMPHET ER 20 MG PO CP24: 20.0000 mg | ORAL_CAPSULE | ORAL | 0 refills | Status: DC

## 2019-11-23 MED ORDER — AMPHETAMINE-DEXTROAMPHETAMINE 20 MG PO TABS: 20.0000 mg | ORAL_TABLET | Freq: Every day | ORAL | 0 refills | Status: DC

## 2019-11-23 NOTE — Telephone Encounter (Signed)
Last office visit 05/03/2019 for ADHD.  Last refilled 10/25/2019 for #30 with no refills.  No future appointments.  No UDS/Contract on file. 

## 2019-11-23 NOTE — Telephone Encounter (Signed)
Last office visit 05/03/2019 for ADHD.  Last refilled 10/25/2019 for #30 with no refills.  No future appointments.  No UDS/Contract on file.

## 2019-12-20 ENCOUNTER — Other Ambulatory Visit: Payer: Self-pay | Admitting: Family Medicine

## 2019-12-20 DIAGNOSIS — F909 Attention-deficit hyperactivity disorder, unspecified type: Secondary | ICD-10-CM

## 2019-12-20 MED ORDER — AMPHETAMINE-DEXTROAMPHETAMINE 20 MG PO TABS: 20.0000 mg | ORAL_TABLET | Freq: Every day | ORAL | 0 refills | Status: DC

## 2019-12-20 MED ORDER — AMPHETAMINE-DEXTROAMPHET ER 20 MG PO CP24: 20.0000 mg | ORAL_CAPSULE | ORAL | 0 refills | Status: DC

## 2019-12-20 NOTE — Telephone Encounter (Signed)
Last office visit 05/03/2019 for ADHD.  Last refilled 11/23/2019 for #30 on each.  No UDS/Contract on file. No future appointments.

## 2020-01-20 ENCOUNTER — Telehealth: Payer: Self-pay | Admitting: Family Medicine

## 2020-01-20 DIAGNOSIS — F909 Attention-deficit hyperactivity disorder, unspecified type: Secondary | ICD-10-CM

## 2020-01-20 NOTE — Telephone Encounter (Signed)
Last office visit 05/03/2019 for ADHD. Last refilled 12/20/2019 for #30 with no refills.  No future appointments.  No UDS/Contract on file.

## 2020-01-22 MED ORDER — AMPHETAMINE-DEXTROAMPHETAMINE 20 MG PO TABS: 20.0000 mg | ORAL_TABLET | Freq: Every day | ORAL | 0 refills | Status: DC

## 2020-01-22 MED ORDER — AMPHETAMINE-DEXTROAMPHET ER 20 MG PO CP24: 20.0000 mg | ORAL_CAPSULE | ORAL | 0 refills | Status: DC

## 2020-01-26 NOTE — Telephone Encounter (Signed)
Left message asking pt to call office  °

## 2020-02-01 ENCOUNTER — Encounter: Payer: Self-pay | Admitting: Family Medicine

## 2020-02-01 NOTE — Telephone Encounter (Signed)
Left message asking pt to call office Mailed letter 

## 2020-02-20 ENCOUNTER — Telehealth: Payer: Self-pay | Admitting: Family Medicine

## 2020-02-20 DIAGNOSIS — F909 Attention-deficit hyperactivity disorder, unspecified type: Secondary | ICD-10-CM

## 2020-02-20 MED ORDER — AMPHETAMINE-DEXTROAMPHETAMINE 20 MG PO TABS: 20.0000 mg | ORAL_TABLET | Freq: Every day | ORAL | 0 refills | Status: DC

## 2020-02-20 MED ORDER — AMPHETAMINE-DEXTROAMPHET ER 20 MG PO CP24: 20.0000 mg | ORAL_CAPSULE | ORAL | 0 refills | Status: DC

## 2020-02-20 NOTE — Telephone Encounter (Signed)
Please have him schedule a full physical in december

## 2020-02-20 NOTE — Telephone Encounter (Signed)
Last office visit 05/03/2019 for ADHD.  Last refilled 01/22/2020 for #30 with no refills.  No future appointments.

## 2020-03-07 NOTE — Telephone Encounter (Signed)
Left message asking pt to call office  °

## 2020-03-21 ENCOUNTER — Encounter: Payer: Self-pay | Admitting: Family Medicine

## 2020-03-21 NOTE — Telephone Encounter (Signed)
Left message asking pt to call office Mailed letter 

## 2020-03-22 ENCOUNTER — Telehealth: Payer: Self-pay | Admitting: Family Medicine

## 2020-03-22 DIAGNOSIS — F909 Attention-deficit hyperactivity disorder, unspecified type: Secondary | ICD-10-CM

## 2020-03-22 MED ORDER — AMPHETAMINE-DEXTROAMPHETAMINE 20 MG PO TABS: 20.0000 mg | ORAL_TABLET | Freq: Every day | ORAL | 0 refills | Status: DC

## 2020-03-22 MED ORDER — AMPHETAMINE-DEXTROAMPHET ER 20 MG PO CP24: 20.0000 mg | ORAL_CAPSULE | ORAL | 0 refills | Status: DC

## 2020-03-22 NOTE — Telephone Encounter (Signed)
Pharmacy requests refill on: amphetamine-dextroamphetamine 20 mg 24 hr  LAST REFILL: 02/20/2020 LAST OV: 05/03/2019 NEXT OV: Not Scheduled PHARMACY: [facility] [phone]

## 2020-03-22 NOTE — Telephone Encounter (Signed)
I have not seen him in a year, and I have to see him annually if I am writing adderall for him.    Set him up for a physical some time in december

## 2020-03-26 ENCOUNTER — Other Ambulatory Visit: Payer: Self-pay | Admitting: Family Medicine

## 2020-03-26 DIAGNOSIS — F909 Attention-deficit hyperactivity disorder, unspecified type: Secondary | ICD-10-CM

## 2020-04-04 NOTE — Telephone Encounter (Signed)
Call left voice message for patient to call the office

## 2020-04-19 NOTE — Telephone Encounter (Signed)
Left voice message for patient to call the office  

## 2020-04-30 ENCOUNTER — Other Ambulatory Visit: Payer: Self-pay | Admitting: Family Medicine

## 2020-04-30 DIAGNOSIS — F909 Attention-deficit hyperactivity disorder, unspecified type: Secondary | ICD-10-CM

## 2020-05-01 MED ORDER — AMPHETAMINE-DEXTROAMPHETAMINE 20 MG PO TABS: 20.0000 mg | ORAL_TABLET | Freq: Every day | ORAL | 0 refills | Status: AC

## 2020-05-01 MED ORDER — AMPHETAMINE-DEXTROAMPHET ER 20 MG PO CP24: 20.0000 mg | ORAL_CAPSULE | ORAL | 0 refills | Status: AC

## 2020-05-01 NOTE — Telephone Encounter (Signed)
Last office visit 05/03/2019 for ADHD.  Last refilled 03/22/2020 for #30 with no refills.  No future appointments.  No UDS/Contract on file.

## 2020-05-02 ENCOUNTER — Encounter: Payer: Self-pay | Admitting: Family Medicine

## 2020-05-02 NOTE — Telephone Encounter (Signed)
Letter sent to pt
